# Patient Record
Sex: Male | Born: 1975 | Race: Black or African American | Hispanic: No | Smoking: Former smoker
Health system: Southern US, Community
[De-identification: ages and names within clinical notes are randomized; demographics above are authoritative.]

## PROBLEM LIST (undated history)

## (undated) DIAGNOSIS — I10 Essential (primary) hypertension: Secondary | ICD-10-CM

## (undated) DIAGNOSIS — G4730 Sleep apnea, unspecified: Secondary | ICD-10-CM

## (undated) DIAGNOSIS — E559 Vitamin D deficiency, unspecified: Secondary | ICD-10-CM

## (undated) DIAGNOSIS — F419 Anxiety disorder, unspecified: Secondary | ICD-10-CM

## (undated) DIAGNOSIS — F17200 Nicotine dependence, unspecified, uncomplicated: Secondary | ICD-10-CM

## (undated) DIAGNOSIS — E119 Type 2 diabetes mellitus without complications: Secondary | ICD-10-CM

---

## 2005-09-10 ENCOUNTER — Emergency Department: Payer: Self-pay | Admitting: General Practice

## 2005-12-01 ENCOUNTER — Emergency Department: Payer: Self-pay | Admitting: Emergency Medicine

## 2008-07-16 ENCOUNTER — Emergency Department: Payer: Self-pay | Admitting: Unknown Physician Specialty

## 2008-12-02 ENCOUNTER — Emergency Department: Payer: Self-pay | Admitting: Emergency Medicine

## 2009-05-13 ENCOUNTER — Ambulatory Visit: Payer: Self-pay | Admitting: Family Medicine

## 2010-04-27 ENCOUNTER — Encounter: Payer: Self-pay | Admitting: Internal Medicine

## 2012-05-03 ENCOUNTER — Emergency Department: Payer: Self-pay | Admitting: Emergency Medicine

## 2012-05-03 LAB — URINALYSIS, COMPLETE
Bacteria: NONE SEEN
Bilirubin,UR: NEGATIVE
Ketone: NEGATIVE
Leukocyte Esterase: NEGATIVE
Protein: NEGATIVE
Specific Gravity: 1.015 (ref 1.003–1.030)
Squamous Epithelial: <1
WBC UR: 1 /HPF (ref 0–5)

## 2012-05-03 LAB — TROPONIN I: Troponin-I: <0.02 ng/mL

## 2012-05-03 LAB — CBC
HCT: 43 % (ref 40.0–52.0)
HGB: 14.7 g/dL (ref 13.0–18.0)
MCH: 29.7 pg (ref 26.0–34.0)
MCHC: 34.1 g/dL (ref 32.0–36.0)
MCV: 87 fL (ref 80–100)
RBC: 4.93 10*6/uL (ref 4.40–5.90)
RDW: 14.5 % (ref 11.5–14.5)

## 2012-05-03 LAB — COMPREHENSIVE METABOLIC PANEL
Alkaline Phosphatase: 78 U/L (ref 50–136)
Anion Gap: 7 (ref 7–16)
BUN: 13 mg/dL (ref 7–18)
Calcium, Total: 8.3 mg/dL — ABNORMAL LOW (ref 8.5–10.1)
Co2: 25 mmol/L (ref 21–32)
Creatinine: 1.07 mg/dL (ref 0.60–1.30)
EGFR (African American): >60
SGOT(AST): 30 U/L (ref 15–37)

## 2012-05-03 LAB — LIPASE, BLOOD: Lipase: 116 U/L (ref 73–393)

## 2012-12-10 ENCOUNTER — Emergency Department: Payer: Self-pay | Admitting: Emergency Medicine

## 2014-06-25 ENCOUNTER — Emergency Department
Admit: 2014-06-25 | Disposition: A | Discharge status: Home / Self Care | Payer: Self-pay | Admitting: Emergency Medicine

## 2014-06-28 LAB — BETA STREP CULTURE(ARMC)

## 2015-01-09 ENCOUNTER — Emergency Department
Admission: EM | Admit: 2015-01-09 | Discharge: 2015-01-09 | Disposition: A | Discharge status: Home / Self Care | Payer: Self-pay | Attending: Emergency Medicine | Admitting: Emergency Medicine

## 2015-01-09 DIAGNOSIS — I10 Essential (primary) hypertension: Secondary | ICD-10-CM | POA: Insufficient documentation

## 2015-01-09 DIAGNOSIS — Z720 Tobacco use: Secondary | ICD-10-CM | POA: Insufficient documentation

## 2015-01-09 DIAGNOSIS — R079 Chest pain, unspecified: Secondary | ICD-10-CM | POA: Insufficient documentation

## 2015-01-09 HISTORY — DX: Essential (primary) hypertension: I10

## 2015-01-09 NOTE — Discharge Instructions (Signed)
Please seek medical attention for any high fevers, chest pain, shortness of breath, change in behavior, persistent vomiting, bloody stool or any other new or concerning symptoms. ° ° °Nonspecific Chest Pain  °Chest pain can be caused by many different conditions. There is always a chance that your pain could be related to something serious, such as a heart attack or a blood clot in your lungs. Chest pain can also be caused by conditions that are not life-threatening. If you have chest pain, it is very important to follow up with your health care provider. °CAUSES  °Chest pain can be caused by: °· Heartburn. °· Pneumonia or bronchitis. °· Anxiety or stress. °· Inflammation around your heart (pericarditis) or lung (pleuritis or pleurisy). °· A blood clot in your lung. °· A collapsed lung (pneumothorax). It can develop suddenly on its own (spontaneous pneumothorax) or from trauma to the chest. °· Shingles infection (varicella-zoster virus). °· Heart attack. °· Damage to the bones, muscles, and cartilage that make up your chest wall. This can include: °¨ Bruised bones due to injury. °¨ Strained muscles or cartilage due to frequent or repeated coughing or overwork. °¨ Fracture to one or more ribs. °¨ Sore cartilage due to inflammation (costochondritis). °RISK FACTORS  °Risk factors for chest pain may include: °· Activities that increase your risk for trauma or injury to your chest. °· Respiratory infections or conditions that cause frequent coughing. °· Medical conditions or overeating that can cause heartburn. °· Heart disease or family history of heart disease. °· Conditions or health behaviors that increase your risk of developing a blood clot. °· Having had chicken pox (varicella zoster). °SIGNS AND SYMPTOMS °Chest pain can feel like: °· Burning or tingling on the surface of your chest or deep in your chest. °· Crushing, pressure, aching, or squeezing pain. °· Dull or sharp pain that is worse when you move, cough, or  take a deep breath. °· Pain that is also felt in your back, neck, shoulder, or arm, or pain that spreads to any of these areas. °Your chest pain may come and go, or it may stay constant. °DIAGNOSIS °Lab tests or other studies may be needed to find the cause of your pain. Your health care provider may have you take a test called an ambulatory ECG (electrocardiogram). An ECG records your heartbeat patterns at the time the test is performed. You may also have other tests, such as: °· Transthoracic echocardiogram (TTE). During echocardiography, sound waves are used to create a picture of all of the heart structures and to look at how blood flows through your heart. °· Transesophageal echocardiogram (TEE). This is a more advanced imaging test that obtains images from inside your body. It allows your health care provider to see your heart in finer detail. °· Cardiac monitoring. This allows your health care provider to monitor your heart rate and rhythm in real time. °· Holter monitor. This is a portable device that records your heartbeat and can help to diagnose abnormal heartbeats. It allows your health care provider to track your heart activity for several days, if needed. °· Stress tests. These can be done through exercise or by taking medicine that makes your heart beat more quickly. °· Blood tests. °· Imaging tests. °TREATMENT  °Your treatment depends on what is causing your chest pain. Treatment may include: °· Medicines. These may include: °¨ Acid blockers for heartburn. °¨ Anti-inflammatory medicine. °¨ Pain medicine for inflammatory conditions. °¨ Antibiotic medicine, if an infection is present. °¨ Medicines   to dissolve blood clots. °¨ Medicines to treat coronary artery disease. °· Supportive care for conditions that do not require medicines. This may include: °¨ Resting. °¨ Applying heat or cold packs to injured areas. °¨ Limiting activities until pain decreases. °HOME CARE INSTRUCTIONS °· If you were prescribed  an antibiotic medicine, finish it all even if you start to feel better. °· Avoid any activities that bring on chest pain. °· Do not use any tobacco products, including cigarettes, chewing tobacco, or electronic cigarettes. If you need help quitting, ask your health care provider. °· Do not drink alcohol. °· Take medicines only as directed by your health care provider. °· Keep all follow-up visits as directed by your health care provider. This is important. This includes any further testing if your chest pain does not go away. °· If heartburn is the cause for your chest pain, you may be told to keep your head raised (elevated) while sleeping. This reduces the chance that acid will go from your stomach into your esophagus. °· Make lifestyle changes as directed by your health care provider. These may include: °¨ Getting regular exercise. Ask your health care provider to suggest some activities that are safe for you. °¨ Eating a heart-healthy diet. A registered dietitian can help you to learn healthy eating options. °¨ Maintaining a healthy weight. °¨ Managing diabetes, if necessary. °¨ Reducing stress. °SEEK MEDICAL CARE IF: °· Your chest pain does not go away after treatment. °· You have a rash with blisters on your chest. °· You have a fever. °SEEK IMMEDIATE MEDICAL CARE IF:  °· Your chest pain is worse. °· You have an increasing cough, or you cough up blood. °· You have severe abdominal pain. °· You have severe weakness. °· You faint. °· You have chills. °· You have sudden, unexplained chest discomfort. °· You have sudden, unexplained discomfort in your arms, back, neck, or jaw. °· You have shortness of breath at any time. °· You suddenly start to sweat, or your skin gets clammy. °· You feel nauseous or you vomit. °· You suddenly feel light-headed or dizzy. °· Your heart begins to beat quickly, or it feels like it is skipping beats. °These symptoms may represent a serious problem that is an emergency. Do not wait to  see if the symptoms will go away. Get medical help right away. Call your local emergency services (911 in the [place] Do not drive yourself to the hospital. °  °This information is not intended to replace advice given to you by your health care provider. Make sure you discuss any questions you have with your health care provider. °  °Document Released: 12/07/2004 Document Revised: 03/20/2014 Document Reviewed: 10/03/2013 °Elsevier Interactive Patient Education ©2016 [employer] ° °

## 2015-01-09 NOTE — ED Provider Notes (Signed)
[facility] Emergency Department Provider Note  ____________________________________________  Time seen: 1110  I have reviewed the triage vital signs and the nursing notes.   HISTORY  Chief Complaint Chest Pain   History limited by: Not Limited   HPI Kevin Garrett is a 39 y.o. male who was registered to be seen after a brief episode of sharp chest pain. The patient was in the emergency department having brought his daughter in to be evaluated for abdominal pain. Whilst waiting he had a 3-4 second episode of sharp left lower chest pain. He denies any associated diaphoresis or shortness of breath. He states he is getting these pains occasionally. He denies any recent fevers. He states he had a sausage biscuit for breakfast this morning. He has never been placed on antacids.  Past Medical History  Diagnosis Date  . Hypertension     There are no active problems to display for this patient.   No past surgical history on file.  No current outpatient prescriptions on file.  Allergies Review of patient's allergies indicates no known allergies.  History reviewed. No pertinent family history.  Social History Social History  Substance Use Topics  . Smoking status: Current Some Day Smoker -- 0.50 packs/day    Types: Cigarettes  . Smokeless tobacco: Never Used  . Alcohol Use: No    Review of Systems  Constitutional: Negative for fever. Cardiovascular: Positive for sharp chest pain Respiratory: Negative for shortness of breath. Gastrointestinal: Negative for abdominal pain, vomiting and diarrhea. Genitourinary: Negative for dysuria. Musculoskeletal: Negative for back pain. Skin: Negative for rash. Neurological: Negative for headaches, focal weakness or numbness.   10-point ROS otherwise negative.  ____________________________________________   PHYSICAL EXAM:  VITAL SIGNS: ED Triage Vitals  Enc Vitals Group     BP --      Pulse Rate  01/09/15 1039 67     Resp --      Temp 01/09/15 1039 98.2 F (36.8 C)     Temp Source 01/09/15 1039 Oral     SpO2 01/09/15 1039 96 %     Weight 01/09/15 1039 265 lb (120.203 kg)     Height 01/09/15 1039 5\' 9"  (1.753 m)     Head Cir --      Peak Flow --      Pain Score 01/09/15 1039 4   Constitutional: Alert and oriented. Well appearing and in no distress. Eyes: Conjunctivae are normal. PERRL. Normal extraocular movements. ENT   Head: Normocephalic and atraumatic.   Nose: No congestion/rhinnorhea.   Mouth/Throat: Mucous membranes are moist.   Neck: No stridor. Hematological/Lymphatic/Immunilogical: No cervical lymphadenopathy. Cardiovascular: Normal rate, regular rhythm.  No murmurs, rubs, or gallops. Respiratory: Normal respiratory effort without tachypnea nor retractions. Breath sounds are clear and equal bilaterally. No wheezes/rales/rhonchi. Gastrointestinal: Soft and nontender. No distention. There is no CVA tenderness. Genitourinary: Deferred Musculoskeletal: Normal range of motion in all extremities. No joint effusions.  No lower extremity tenderness nor edema. Neurologic:  Normal speech and language. No gross focal neurologic deficits are appreciated. Speech is normal.  Skin:  Skin is warm, dry and intact. No rash noted. Psychiatric: Mood and affect are normal. Speech and behavior are normal. Patient exhibits appropriate insight and judgment.  ____________________________________________    LABS (pertinent positives/negatives)  None  ____________________________________________   EKG  I, Nance Pear, attending physician, personally viewed and interpreted this EKG  EKG Time: 1037 Rate: 64 Rhythm: NSR Axis: normal Intervals: qtc 441 QRS: narrow ST changes:  no st elevation, t wave inversion V6 Impression: T wave inversion V6, otherwise normal ekg  ____________________________________________     RADIOLOGY  None   ____________________________________________   PROCEDURES  Procedure(s) performed: None  Critical Care performed: No  ____________________________________________   INITIAL IMPRESSION / ASSESSMENT AND PLAN / ED COURSE  Pertinent labs & imaging results that were available during my care of the patient were reviewed by me and considered in my medical decision making (see chart for details).  Patient checked in because of brief episode of sharp chest pain. No diaphoresis or shortness of breath. No radiation. Given clinical history I doubt ACS. EKG without any ST elevation. I think more likely patient having episodes of GERD given that this has been recurrent. Discussed with patient he can try an antacid.  ____________________________________________   FINAL CLINICAL IMPRESSION(S) / ED DIAGNOSES  Final diagnoses:  Chest pain, unspecified chest pain type     Nance Pear, MD 01/09/15 1135

## 2015-01-09 NOTE — ED Notes (Signed)
Pt in ED with family and developed CP on the lobby. Pt denies and SOB or dyspnea.

## 2015-01-28 ENCOUNTER — Encounter: Payer: Self-pay | Admitting: Emergency Medicine

## 2015-01-28 ENCOUNTER — Emergency Department
Admission: EM | Admit: 2015-01-28 | Discharge: 2015-01-28 | Disposition: A | Discharge status: Home / Self Care | Payer: Self-pay | Attending: Emergency Medicine | Admitting: Emergency Medicine

## 2015-01-28 ENCOUNTER — Emergency Department: Payer: Self-pay

## 2015-01-28 DIAGNOSIS — J029 Acute pharyngitis, unspecified: Secondary | ICD-10-CM | POA: Insufficient documentation

## 2015-01-28 DIAGNOSIS — F17210 Nicotine dependence, cigarettes, uncomplicated: Secondary | ICD-10-CM | POA: Insufficient documentation

## 2015-01-28 DIAGNOSIS — I10 Essential (primary) hypertension: Secondary | ICD-10-CM | POA: Insufficient documentation

## 2015-01-28 LAB — COMPREHENSIVE METABOLIC PANEL
ALBUMIN: 4 g/dL (ref 3.5–5.0)
ALK PHOS: 67 U/L (ref 38–126)
ALT: 14 U/L — AB (ref 17–63)
ANION GAP: 6 (ref 5–15)
AST: 20 U/L (ref 15–41)
BILIRUBIN TOTAL: 1 mg/dL (ref 0.3–1.2)
BUN: 16 mg/dL (ref 6–20)
CALCIUM: 9 mg/dL (ref 8.9–10.3)
CO2: 28 mmol/L (ref 22–32)
CREATININE: 1.16 mg/dL (ref 0.61–1.24)
Chloride: 101 mmol/L (ref 101–111)
GFR calc Af Amer: >60 mL/min (ref >60)
GFR calc non Af Amer: >60 mL/min (ref >60)
GLUCOSE: 107 mg/dL — AB (ref 65–99)
Potassium: 3.9 mmol/L (ref 3.5–5.1)
SODIUM: 135 mmol/L (ref 135–145)
TOTAL PROTEIN: 7.8 g/dL (ref 6.5–8.1)

## 2015-01-28 LAB — CBC WITH DIFFERENTIAL/PLATELET
BASOS PCT: 1 %
Basophils Absolute: 0 10*3/uL (ref 0–0.1)
EOS ABS: 0.1 10*3/uL (ref 0–0.7)
Eosinophils Relative: 1 %
HEMATOCRIT: 43.9 % (ref 40.0–52.0)
HEMOGLOBIN: 14.5 g/dL (ref 13.0–18.0)
Lymphocytes Relative: 21 %
Lymphs Abs: 1.6 10*3/uL (ref 1.0–3.6)
MCH: 28.8 pg (ref 26.0–34.0)
MCHC: 33 g/dL (ref 32.0–36.0)
MCV: 87.2 fL (ref 80.0–100.0)
Monocytes Absolute: 0.8 10*3/uL (ref 0.2–1.0)
Monocytes Relative: 11 %
NEUTROS ABS: 5.1 10*3/uL (ref 1.4–6.5)
NEUTROS PCT: 66 %
Platelets: 173 10*3/uL (ref 150–440)
RBC: 5.03 MIL/uL (ref 4.40–5.90)
RDW: 14.3 % (ref 11.5–14.5)
WBC: 7.6 10*3/uL (ref 3.8–10.6)

## 2015-01-28 MED ORDER — SODIUM CHLORIDE 0.9 % IV BOLUS (SEPSIS)
1000.0000 mL | Freq: Once | INTRAVENOUS | Status: AC
  Administered 2015-01-28: 1000 mL via INTRAVENOUS

## 2015-01-28 MED ORDER — AMOXICILLIN-POT CLAVULANATE 875-125 MG PO TABS: 1.0000 | ORAL_TABLET | Freq: Two times a day (BID) | ORAL | Status: DC

## 2015-01-28 MED ORDER — DEXAMETHASONE SODIUM PHOSPHATE 10 MG/ML IJ SOLN
10.0000 mg | Freq: Once | INTRAMUSCULAR | Status: AC
  Administered 2015-01-28: 10 mg via INTRAVENOUS
  Filled 2015-01-28: qty 1

## 2015-01-28 MED ORDER — ACETAMINOPHEN-CODEINE #3 300-30 MG PO TABS: 1.0000 | ORAL_TABLET | ORAL | Status: DC | PRN

## 2015-01-28 MED ORDER — PREDNISONE 10 MG (21) PO TBPK: ORAL_TABLET | ORAL | Status: DC

## 2015-01-28 MED ORDER — KETOROLAC TROMETHAMINE 30 MG/ML IJ SOLN
30.0000 mg | Freq: Once | INTRAMUSCULAR | Status: AC
  Administered 2015-01-28: 30 mg via INTRAVENOUS
  Filled 2015-01-28: qty 1

## 2015-01-28 MED ORDER — IOHEXOL 300 MG/ML  SOLN
75.0000 mL | Freq: Once | INTRAMUSCULAR | Status: AC | PRN
  Administered 2015-01-28: 75 mL via INTRAVENOUS
  Filled 2015-01-28: qty 75

## 2015-01-28 NOTE — ED Provider Notes (Signed)
[facility] Emergency Department Provider Note  ____________________________________________  Time seen: Approximately 10:55 AM  I have reviewed the triage vital signs and the nursing notes.   HISTORY  Chief Complaint Sore Throat   HPI Kevin Garrett is a 39 y.o. male who presents to the emergency department for evaluation of sore throat. He states that the pain started on Sunday and has progressively worsened. He states that he has had a fever off-and-on. He states that it is very painful to swallow.   Past Medical History  Diagnosis Date  . Hypertension     There are no active problems to display for this patient.   History reviewed. No pertinent past surgical history.  Current Outpatient Rx  Name  Route  Sig  Dispense  Refill  . acetaminophen-codeine (TYLENOL #3) 300-30 MG tablet   Oral   Take 1-2 tablets by mouth every 4 (four) hours as needed for moderate pain.   12 tablet   0   . amoxicillin-clavulanate (AUGMENTIN) 875-125 MG tablet   Oral   Take 1 tablet by mouth 2 (two) times daily.   20 tablet   0   . predniSONE (STERAPRED UNI-PAK 21 TAB) 10 MG (21) TBPK tablet      Take 6 tablets on day 1 Take 5 tablets on day 2 Take 4 tablets on day 3 Take 3 tablets on day 4 Take 2 tablets on day 5 Take 1 tablet on day 6   21 tablet   0     Allergies Review of patient's allergies indicates no known allergies.  History reviewed. No pertinent family history.  Social History Social History  Substance Use Topics  . Smoking status: Current Some Day Smoker -- 0.50 packs/day    Types: Cigarettes  . Smokeless tobacco: Never Used  . Alcohol Use: No    Review of Systems Constitutional: Positive for fever. Eyes: No visual changes. ENT: Positive for sore throat; positive for difficulty swallowing. Respiratory: Denies shortness of breath. Gastrointestinal: No abdominal pain.  No nausea, no vomiting.  No diarrhea.  Genitourinary: Negative  for dysuria. Musculoskeletal: Positive for generalized body aches. Skin: Negative for rash. Neurological: Negative for headaches, focal weakness or numbness.  10-point ROS otherwise negative.  ____________________________________________   PHYSICAL EXAM:  VITAL SIGNS: ED Triage Vitals  Enc Vitals Group     BP 01/28/15 1035 158/98 mmHg     Pulse Rate 01/28/15 1035 78     Resp 01/28/15 1035 20     Temp 01/28/15 1035 98.7 F (37.1 C)     Temp Source 01/28/15 1035 Oral     SpO2 01/28/15 1035 96 %     Weight 01/28/15 1035 265 lb (120.203 kg)     Height --      Head Cir --      Peak Flow --      Pain Score 01/28/15 1035 10     Pain Loc --      Pain Edu? --      Excl. in [place]? --     Constitutional: Alert and oriented. Well appearing and in no acute distress. Eyes: Conjunctivae are normal. PERRL. EOMI. Head: Atraumatic. Nose: No congestion/rhinnorhea. Mouth/Throat: Mucous membranes are moist.  Oropharynx erythematous and mildly edematous, positive for exudate. Right tonsil appears enlarged in comparison with left. Neck: No stridor.  Lymphatic: Cervical adenopathy noted laterally Cardiovascular: Normal rate, regular rhythm. Good peripheral circulation. Respiratory: Normal respiratory effort. Lungs CTAB. Gastrointestinal: Soft and nontender. Musculoskeletal: No  lower extremity tenderness nor edema.   Neurologic:  Normal speech and language. No gross focal neurologic deficits are appreciated. Speech is normal. No gait instability. Skin:  Skin is warm, dry and intact. No rash noted Psychiatric: Mood and affect are normal. Speech and behavior are normal.  ____________________________________________   LABS (all labs ordered are listed, but only abnormal results are displayed)  Labs Reviewed  COMPREHENSIVE METABOLIC PANEL - Abnormal; Notable for the following:    Glucose, Bld 107 (*)    ALT 14 (*)    All other components within normal limits  CBC WITH DIFFERENTIAL/PLATELET    ____________________________________________  EKG   ____________________________________________  RADIOLOGY  Mild bilateral tonsillar enlargement/edema. No evidence of tonsillar/peritonsillar abscess. ____________________________________________   PROCEDURES  Procedure(s) performed: None  Critical Care performed: No  ____________________________________________   INITIAL IMPRESSION / ASSESSMENT AND PLAN / ED COURSE  Pertinent labs & imaging results that were available during my care of the patient were reviewed by me and considered in my medical decision making (see chart for details).  IV Unasyn given in the emergency department along with decadron and toradol with some relief. He is to follow up with the ENT specialist if not improving over the next 2-3 days. Return precautions given. ____________________________________________   FINAL CLINICAL IMPRESSION(S) / ED DIAGNOSES  Final diagnoses:  Acute pharyngitis, unspecified etiology      Victorino Dike, FNP 01/28/15 1335  Ahmed Prima, MD 01/28/15 [phone]

## 2015-01-28 NOTE — Discharge Instructions (Signed)

## 2015-01-28 NOTE — ED Notes (Signed)
Pt to ed with c/o sore throat x 4 days.

## 2015-09-30 ENCOUNTER — Emergency Department
Admission: EM | Admit: 2015-09-30 | Discharge: 2015-09-30 | Disposition: A | Discharge status: Home / Self Care | Payer: Self-pay | Attending: Emergency Medicine | Admitting: Emergency Medicine

## 2015-09-30 ENCOUNTER — Encounter: Payer: Self-pay | Admitting: Emergency Medicine

## 2015-09-30 ENCOUNTER — Emergency Department: Payer: Self-pay

## 2015-09-30 DIAGNOSIS — I10 Essential (primary) hypertension: Secondary | ICD-10-CM | POA: Insufficient documentation

## 2015-09-30 DIAGNOSIS — F17210 Nicotine dependence, cigarettes, uncomplicated: Secondary | ICD-10-CM | POA: Insufficient documentation

## 2015-09-30 DIAGNOSIS — Z79899 Other long term (current) drug therapy: Secondary | ICD-10-CM | POA: Insufficient documentation

## 2015-09-30 DIAGNOSIS — R42 Dizziness and giddiness: Secondary | ICD-10-CM | POA: Insufficient documentation

## 2015-09-30 HISTORY — DX: Anxiety disorder, unspecified: F41.9

## 2015-09-30 LAB — BASIC METABOLIC PANEL
ANION GAP: 7 (ref 5–15)
BUN: 10 mg/dL (ref 6–20)
CALCIUM: 8.6 mg/dL — AB (ref 8.9–10.3)
CO2: 28 mmol/L (ref 22–32)
Chloride: 102 mmol/L (ref 101–111)
Creatinine, Ser: 0.98 mg/dL (ref 0.61–1.24)
Glucose, Bld: 134 mg/dL — ABNORMAL HIGH (ref 65–99)
Potassium: 3.3 mmol/L — ABNORMAL LOW (ref 3.5–5.1)
SODIUM: 137 mmol/L (ref 135–145)

## 2015-09-30 LAB — CBC
HCT: 43.9 % (ref 40.0–52.0)
HEMOGLOBIN: 15.2 g/dL (ref 13.0–18.0)
MCH: 29.8 pg (ref 26.0–34.0)
MCHC: 34.6 g/dL (ref 32.0–36.0)
MCV: 86.2 fL (ref 80.0–100.0)
Platelets: 201 10*3/uL (ref 150–440)
RBC: 5.1 MIL/uL (ref 4.40–5.90)
RDW: 14.5 % (ref 11.5–14.5)
WBC: 4.3 10*3/uL (ref 3.8–10.6)

## 2015-09-30 MED ORDER — MECLIZINE HCL 12.5 MG PO TABS: 12.5000 mg | ORAL_TABLET | Freq: Three times a day (TID) | ORAL | Status: DC | PRN

## 2015-09-30 MED ORDER — MECLIZINE HCL 25 MG PO TABS
25.0000 mg | ORAL_TABLET | Freq: Once | ORAL | Status: AC
  Administered 2015-09-30: 25 mg via ORAL
  Filled 2015-09-30: qty 1

## 2015-09-30 NOTE — ED Notes (Addendum)
C/O dizziness and blurred vision onset of symptoms yesterday.  States symptoms worsening.  BP initialy 153/100 per EMS

## 2015-09-30 NOTE — ED Provider Notes (Signed)
Time Seen: Approximately *0754 I have reviewed the triage notes  Chief Complaint: Dizziness   History of Present Illness: Kevin Garrett is a 40 y.o. male who presents with feeling of dizziness that occurred over the last 48 hours. He states it started yesterday morning and during work. He states he's been very sleepy. He denies any headaches but has had some ringing and his right ear. He denies any visual field deficits. He states he's had blurred vision that usually corrects after he lies still for a period of time. He describes his dizziness as being vertiginous in nature where he states he feels like he is spinning. He states his symptoms still exhaust when he lies still. He denies any neck pain or fever. Patient has a known history of hypertension states he has been taking his medications. No nausea or vomiting  Past Medical History  Diagnosis Date  . Hypertension   . Anxiety     There are no active problems to display for this patient.   History reviewed. No pertinent past surgical history.  History reviewed. No pertinent past surgical history.  No current outpatient prescriptions on file.  Allergies:  Review of patient's allergies indicates no known allergies.  Family History: No family history on file.  Social History: Social History  Substance Use Topics  . Smoking status: Current Some Day Smoker -- 0.50 packs/day    Types: Cigarettes  . Smokeless tobacco: Never Used  . Alcohol Use: No     Review of Systems:   10 point review of systems was performed and was otherwise negative:  Constitutional: No fever Eyes: NoCurrent visual disturbances ENT: No sore throat, ear pain Cardiac: No chest pain Respiratory: No shortness of breath, wheezing, or stridor Abdomen: No abdominal pain, no vomiting, No diarrhea Endocrine: No weight loss, No night sweats Extremities: No peripheral edema, cyanosis Skin: No rashes, easy bruising Neurologic: No focal weakness, trouble  with speech or swollowing Urologic: No dysuria, Hematuria, or urinary frequency   Physical Exam:  ED Triage Vitals  Enc Vitals Group     BP 09/30/15 0716 132/92 mmHg     Pulse --      Resp 09/30/15 0716 15     Temp 09/30/15 0716 97.9 F (36.6 C)     Temp Source 09/30/15 0716 Oral     SpO2 09/30/15 0716 95 %     Weight 09/30/15 0716 283 lb (128.368 kg)     Height 09/30/15 0716 5\' 9"  (1.753 m)     Head Cir --      Peak Flow --      Pain Score 09/30/15 0715 0     Pain Loc --      Pain Edu? --      Excl. in [place]? --     General: Awake , Alert , and Oriented times 3; GCS 15 Head: Normal cephalic , atraumatic Eyes: Pupils equal , round, reactive to light TMs are negative bilaterally for erythema or exudate Nose/Throat: No nasal drainage, patent upper airway without erythema or exudate.  Neck: Supple, Full range of motion, No anterior adenopathy or palpable thyroid masses Lungs: Clear to ascultation without wheezes , rhonchi, or rales Heart: Regular rate, regular rhythm without murmurs , gallops , or rubs Abdomen: Soft, non tender without rebound, guarding , or rigidity; bowel sounds positive and symmetric in all 4 quadrants. No organomegaly .        Extremities: 2 plus symmetric pulses. No edema, clubbing or cyanosis  Neurologic: normal ambulation, Motor symmetric without deficits, sensory intact. Patient had an positive symptoms with Epley maneuver Skin: warm, dry, no rashes   Labs:   All laboratory work was reviewed including any pertinent negatives or positives listed below:  Labs Reviewed  BASIC METABOLIC PANEL - Abnormal; Notable for the following:    Potassium 3.3 (*)    Glucose, Bld 134 (*)    Calcium 8.6 (*)    All other components within normal limits  CBC  URINALYSIS COMPLETEWITH MICROSCOPIC ([facility] ONLY)  CBG MONITORING, ED    EKG: * ED ECG REPORT I, Daymon Larsen, the attending physician, personally viewed and interpreted this ECG.  Date: 09/30/2015 EKG  Time: 0715 Rate: 81 Rhythm: normal sinus rhythm QRS Axis: normal Intervals: normal ST/T Wave abnormalities: Nonspecific T wave abnormality  Conduction Disturbances: none Narrative Interpretation: unremarkable No significant change in comparison to 01/09/2015   Radiology: *       CT HEAD WO CONTRAST (Final result) Result time: 09/30/15 08:13:27   Final result by Rad Results In Interface (09/30/15 08:13:27)   Narrative:   CLINICAL DATA: Dizziness and weakness since yesterday.  EXAM: CT HEAD WITHOUT CONTRAST  TECHNIQUE: Contiguous axial images were obtained from the base of the skull through the vertex without intravenous contrast.  COMPARISON: None.  FINDINGS: Brain: Gray-white differentiation is maintained. No CT evidence of acute large territory infarct. No intraparenchymal or extra-axial mass or hemorrhage. Normal size a configuration of the ventricles and basilar cisterns. No midline shift.  Vascular: No hyperdense vessel or unexpected calcification.  Skull: No displaced calvarial fracture.  Sinuses/Orbits: Limited visualization the paranasal sinuses and mastoid air cells is normal. No air-fluid levels.  Other: Regional soft tissues appear normal.  IMPRESSION: Negative noncontrast head CT.   Electronically Signed By: Sandi Mariscal M.D. On: 09/30/2015 08:13        I personally reviewed the radiologic studies   ED Course:  Patient felt improved after Epley maneuvers and some oral Antivert. Patient had a head CT due to his first episode of vertigo. Patient does not appear to have any signs of central vertigo at this time. Patient's otherwise hemodynamically stable.   Assessment:  Peripheral vertigo*      Plan:  Outpatient management Patient was advised to return immediately if condition worsens. Patient was advised to follow up with their primary care physician or other specialized physicians involved in their outpatient care. The  patient and/or family member/power of attorney had laboratory results reviewed at the bedside. All questions and concerns were addressed and appropriate discharge instructions were distributed by the nursing staff.             Daymon Larsen, MD 09/30/15 1007

## 2015-09-30 NOTE — ED Notes (Signed)
AAOx3.  Skin warm and dry.  Ambulates with easy and steady gait. NAD 

## 2015-09-30 NOTE — Discharge Instructions (Signed)
Vertigo Vertigo means you feel like you or your surroundings are moving when they are not. Vertigo can be dangerous if it occurs when you are at work, driving, or performing difficult activities.  CAUSES  Vertigo occurs when there is a conflict of signals sent to your brain from the visual and sensory systems in your body. There are many different causes of vertigo, including:  Infections, especially in the inner ear.  A bad reaction to a drug or misuse of alcohol and medicines.  Withdrawal from drugs or alcohol.  Rapidly changing positions, such as lying down or rolling over in bed.  A migraine headache.  Decreased blood flow to the brain.  Increased pressure in the brain from a head injury, infection, tumor, or bleeding. SYMPTOMS  You may feel as though the world is spinning around or you are falling to the ground. Because your balance is upset, vertigo can cause nausea and vomiting. You may have involuntary eye movements (nystagmus). DIAGNOSIS  Vertigo is usually diagnosed by physical exam. If the cause of your vertigo is unknown, your caregiver may perform imaging tests, such as an MRI scan (magnetic resonance imaging). TREATMENT  Most cases of vertigo resolve on their own, without treatment. Depending on the cause, your caregiver may prescribe certain medicines. If your vertigo is related to body position issues, your caregiver may recommend movements or procedures to correct the problem. In rare cases, if your vertigo is caused by certain inner ear problems, you may need surgery. HOME CARE INSTRUCTIONS   Follow your caregiver's instructions.  Avoid driving.  Avoid operating heavy machinery.  Avoid performing any tasks that would be dangerous to you or others during a vertigo episode.  Tell your caregiver if you notice that certain medicines seem to be causing your vertigo. Some of the medicines used to treat vertigo episodes can actually make them worse in some people. SEEK  IMMEDIATE MEDICAL CARE IF:   Your medicines do not relieve your vertigo or are making it worse.  You develop problems with talking, walking, weakness, or using your arms, hands, or legs.  You develop severe headaches.  Your nausea or vomiting continues or gets worse.  You develop visual changes.  A family member notices behavioral changes.  Your condition gets worse. MAKE SURE YOU:  Understand these instructions.  Will watch your condition.  Will get help right away if you are not doing well or get worse.   This information is not intended to replace advice given to you by your health care provider. Make sure you discuss any questions you have with your health care provider.   Document Released: 12/07/2004 Document Revised: 05/22/2011 Document Reviewed: 06/22/2014 Elsevier Interactive Patient Education [employer].   Please return immediately if condition worsens. Please contact her primary physician or the physician you were given for referral. If you have any specialist physicians involved in her treatment and plan please also contact them. Thank you for using [facility].

## 2015-12-21 ENCOUNTER — Encounter: Payer: Self-pay | Admitting: Emergency Medicine

## 2015-12-21 ENCOUNTER — Emergency Department
Admission: EM | Admit: 2015-12-21 | Discharge: 2015-12-21 | Disposition: A | Discharge status: Home / Self Care | Payer: Self-pay | Attending: Emergency Medicine | Admitting: Emergency Medicine

## 2015-12-21 DIAGNOSIS — Z79899 Other long term (current) drug therapy: Secondary | ICD-10-CM | POA: Insufficient documentation

## 2015-12-21 DIAGNOSIS — J069 Acute upper respiratory infection, unspecified: Secondary | ICD-10-CM | POA: Insufficient documentation

## 2015-12-21 DIAGNOSIS — B9789 Other viral agents as the cause of diseases classified elsewhere: Secondary | ICD-10-CM

## 2015-12-21 DIAGNOSIS — F17210 Nicotine dependence, cigarettes, uncomplicated: Secondary | ICD-10-CM | POA: Insufficient documentation

## 2015-12-21 DIAGNOSIS — I10 Essential (primary) hypertension: Secondary | ICD-10-CM | POA: Insufficient documentation

## 2015-12-21 DIAGNOSIS — E118 Type 2 diabetes mellitus with unspecified complications: Secondary | ICD-10-CM

## 2015-12-21 DIAGNOSIS — E119 Type 2 diabetes mellitus without complications: Secondary | ICD-10-CM | POA: Insufficient documentation

## 2015-12-21 LAB — GLUCOSE, CAPILLARY: GLUCOSE-CAPILLARY: 137 mg/dL — AB (ref 65–99)

## 2015-12-21 MED ORDER — PROMETHAZINE-DM 6.25-15 MG/5ML PO SYRP: 5.0000 mL | ORAL_SOLUTION | Freq: Four times a day (QID) | ORAL | 0 refills | Status: DC | PRN

## 2015-12-21 MED ORDER — LORATADINE 10 MG PO TABS: 10.0000 mg | ORAL_TABLET | Freq: Every day | ORAL | 0 refills | Status: DC

## 2015-12-21 NOTE — ED Provider Notes (Signed)
[facility] Emergency Department Provider Note  ____________________________________________  Time seen: Approximately 7:31 AM  I have reviewed the triage vital signs and the nursing notes.   HISTORY  Chief Complaint Cough    HPI SAHITH Garrett is a 40 y.o. male , NAD, presents to the emergency room today history of cough. Patient has had a dry cough with mild headache over the last couple of days. Has noted some increased urination and blurred vision over the last 12 hours. Woke this morning had a coughing fit and felt short of breath therefore called 911. Patient was concerned that his blood pressure was elevated and he wanted his blood pressure checked by EMS. Has a history of type 2 diabetes but is not on any medications and states he is controlled by diet. Has not checked his blood sugars in some time. Has not had no further episodes of shortness of breath but has had cough since being transported by EMS. States he feels better since arriving to the hospital. Has had no chest pain, wheezing, chest congestion. Denies nasal congestion, runny nose, ear pain. Has had some sinus headaches but loss of vision. No fevers, chills or body aches. Has not taken anything over-the-counter for his symptoms. Denies dysuria, hematuria. No abdominal pain, nausea or vomiting.   Past Medical History:  Diagnosis Date  . Anxiety   . Hypertension     There are no active problems to display for this patient.   History reviewed. No pertinent surgical history.  Prior to Admission medications   Medication Sig Start Date End Date Taking? Authorizing Provider  amLODipine (NORVASC) 10 MG tablet Take 10 mg by mouth daily.    Historical Provider, MD  citalopram (CELEXA) 20 MG tablet Take 30 mg by mouth daily.    Historical Provider, MD  loratadine (CLARITIN) 10 MG tablet Take 1 tablet (10 mg total) by mouth daily. 12/21/15   Mahmoud Blazejewski L Sharla Tankard, PA-C  meclizine (ANTIVERT) 12.5 MG tablet  Take 1 tablet (12.5 mg total) by mouth 3 (three) times daily as needed for dizziness or nausea. 09/30/15   Daymon Larsen, MD  promethazine-dextromethorphan (PROMETHAZINE-DM) 6.25-15 MG/5ML syrup Take 5 mLs by mouth 4 (four) times daily as needed for cough. 12/21/15   Iram Lundberg L Nadelyn Enriques, PA-C  tamsulosin (FLOMAX) 0.4 MG CAPS capsule Take 0.4 mg by mouth daily.    Historical Provider, MD    Allergies Review of patient's allergies indicates no known allergies.  History reviewed. No pertinent family history.  Social History Social History  Substance Use Topics  . Smoking status: Current Some Day Smoker    Packs/day: 0.50    Types: Cigarettes  . Smokeless tobacco: Never Used  . Alcohol use No     Review of Systems  Constitutional: No fever/chills Eyes: No visual changes. No discharge ENT: No sore throatOr nasal congestion, runny nose, ear pain. Cardiovascular: No chest pain. Respiratory: Positive cough with associated shortness of breath with cough only. No wheezing.  Gastrointestinal: No abdominal pain.  No nausea, vomiting.   Genitourinary: Positive increased urinary frequency. Negative for dysuria, hematuria. No urinary hesitancy, urgency Musculoskeletal: Negative for general myalgias.  Skin: Negative for rash. Neurological: Positive for sinus headaches, but no focal weakness or numbness. 10-point ROS otherwise negative.  ____________________________________________   PHYSICAL EXAM:  VITAL SIGNS: ED Triage Vitals [12/21/15 0643]  Enc Vitals Group     BP (!) 152/99     Pulse Rate 82     Resp 14  Temp      Temp src      SpO2 99 %     Weight 260 lb (117.9 kg)     Height 5\' 9"  (1.753 m)     Head Circumference      Peak Flow      Pain Score      Pain Loc      Pain Edu?      Excl. in [place]?      Constitutional: Alert and oriented. Well appearing and in no acute distress. Eyes: Conjunctivae are normal Without icterus or injection Head: Atraumatic. ENT:      Ears: TMs  visualized bilaterally without erythema, bulging, effusion, perforation      Nose: No congestion/rhinnorhea.      Mouth/Throat: Mucous membranes are moist. Pharynx without erythema, swelling, exudate. Airway is patent but small due to large tongue and wide neck. Neck: Supple with full range of motion. Hematological/Lymphatic/Immunilogical: No cervical lymphadenopathy. Cardiovascular: Normal rate, regular rhythm. Normal S1 and S2.  No murmurs, rubs, gallops. Good peripheral circulation. Respiratory: Normal respiratory effort without tachypnea or retractions. Lungs CTAB with breath sounds noted in all lung fields. No wheeze, rhonchi, rales. Neurologic:  Normal speech and language. No gross focal neurologic deficits are appreciated.  Skin:  Skin is warm, dry and intact. No rash noted. Psychiatric: Mood and affect are normal. Speech and behavior are normal. Patient exhibits appropriate insight and judgement.   ____________________________________________   LABS (all labs ordered are listed, but only abnormal results are displayed)  Labs Reviewed  GLUCOSE, CAPILLARY - Abnormal; Notable for the following:       Result Value   Glucose-Capillary 137 (*)    All other components within normal limits  CBG MONITORING, ED   ____________________________________________  EKG  None ____________________________________________  RADIOLOGY  None ____________________________________________    PROCEDURES  Procedure(s) performed: None   Procedures   Medications - No data to display   ____________________________________________   INITIAL IMPRESSION / ASSESSMENT AND PLAN / ED COURSE  Pertinent labs & imaging results that were available during my care of the patient were reviewed by me and considered in my medical decision making (see chart for details).  Clinical Course  Comment By Time  Discussed lab results with the patient. He has not had anything to eat or drink other than  water since yesterday afternoon. Discussed that I anticipate his blood sugar would've been higher had heat ML later in the day. Recommended that he follow-up with his primary care provider at the [facility] to discuss medication management of his type 2 diabetes. Encouraged him to continue to follow her diabetic diet and keep himself hydrated with water as he has been doing. Information will be given to him in regards to diabetes and sick daily management as it can cause blood sugars to rise when he is ill. I anticipate the changes in vision and increased urinary frequency is related to uncontrolled diabetes. Calyx Hawker L Hawken Bielby, PA-C 10/10 0805    Patient's diagnosis is consistent with Viral URI with cough and type 2 diabetes with unspecified, patient's. Patient will be discharged home with prescriptions for loratadine and promethazine DM cough syrup to take as directed. Patient is advised to follow-up with his primary care provider at the [facility] to recheck sugars and discussed the possibility of medication management of diabetes. Patient is given ED precautions to return to the ED for any worsening or new symptoms.    ____________________________________________  FINAL CLINICAL  IMPRESSION(S) / ED DIAGNOSES  Final diagnoses:  Viral URI with cough  Type 2 diabetes mellitus with complication, without long-term current use of insulin (HCC)      NEW MEDICATIONS STARTED DURING THIS VISIT:  New Prescriptions   LORATADINE (CLARITIN) 10 MG TABLET    Take 1 tablet (10 mg total) by mouth daily.   PROMETHAZINE-DEXTROMETHORPHAN (PROMETHAZINE-DM) 6.25-15 MG/5ML SYRUP    Take 5 mLs by mouth 4 (four) times daily as needed for cough.         Braxton Feathers, PA-C 12/21/15 [id number]    Schuyler Amor, MD 12/21/15 [phone]

## 2015-12-21 NOTE — ED Triage Notes (Signed)
Pt arrived to triage by EMS with reports of cough and headache x2 days. Pt denies SOB at triage. Pt O2 100% on RA. Pt very talkative and not able to stay still.

## 2016-09-14 ENCOUNTER — Emergency Department
Admission: EM | Admit: 2016-09-14 | Discharge: 2016-09-14 | Disposition: A | Discharge status: Home / Self Care | Payer: Self-pay | Attending: Emergency Medicine | Admitting: Emergency Medicine

## 2016-09-14 ENCOUNTER — Emergency Department: Payer: Self-pay

## 2016-09-14 ENCOUNTER — Encounter: Payer: Self-pay | Admitting: Emergency Medicine

## 2016-09-14 DIAGNOSIS — Z79899 Other long term (current) drug therapy: Secondary | ICD-10-CM | POA: Insufficient documentation

## 2016-09-14 DIAGNOSIS — R0781 Pleurodynia: Secondary | ICD-10-CM

## 2016-09-14 DIAGNOSIS — R0789 Other chest pain: Secondary | ICD-10-CM | POA: Insufficient documentation

## 2016-09-14 DIAGNOSIS — F17210 Nicotine dependence, cigarettes, uncomplicated: Secondary | ICD-10-CM | POA: Insufficient documentation

## 2016-09-14 DIAGNOSIS — I10 Essential (primary) hypertension: Secondary | ICD-10-CM | POA: Insufficient documentation

## 2016-09-14 DIAGNOSIS — R52 Pain, unspecified: Secondary | ICD-10-CM

## 2016-09-14 MED ORDER — ACETAMINOPHEN 500 MG PO TABS
1000.0000 mg | ORAL_TABLET | Freq: Once | ORAL | Status: AC
  Administered 2016-09-14: 1000 mg via ORAL
  Filled 2016-09-14: qty 2

## 2016-09-14 MED ORDER — OXYCODONE HCL 5 MG PO TABS
5.0000 mg | ORAL_TABLET | Freq: Once | ORAL | Status: AC
  Administered 2016-09-14: 5 mg via ORAL
  Filled 2016-09-14: qty 1

## 2016-09-14 MED ORDER — NAPROXEN 500 MG PO TABS: 500.0000 mg | ORAL_TABLET | Freq: Two times a day (BID) | ORAL | 0 refills | Status: DC

## 2016-09-14 NOTE — ED Notes (Signed)
ED Provider at bedside. 

## 2016-09-14 NOTE — ED Triage Notes (Signed)
Pt fell 2 weeks ago and hit lower right ribs. Has had pain same area with increase in pain with deep breathing. Pt sneezed at 1330 today and pain got worse. Pain 8/10 today.

## 2016-09-14 NOTE — ED Provider Notes (Signed)
[facility] Emergency Department Provider Note  ____________________________________________  Time seen: Approximately 5:37 PM  I have reviewed the triage vital signs and the nursing notes.   HISTORY  Chief Complaint Rib Injury   HPI Kevin Garrett is a 41 y.o. male the history of hypertension who presents for evaluation of right rib cage pain. Patient reports 2 weeks ago he fell while coming out of the tub and hit the right side of his chest wall onto the lateral edge of the tub. He has had continuous pain on the right rib cage that is worse with movement and inspiration. Today he reports that he had a very hard sneeze and since then the pain has gotten worse currently complaining of severe sharp pain located in the right lateral lower rib cage area constant and non radiating. No shortness of breath. He has had a dry cough for the last few days but no fever, chills, nausea, vomiting.  Past Medical History:  Diagnosis Date  . Anxiety   . Hypertension     There are no active problems to display for this patient.   History reviewed. No pertinent surgical history.  Prior to Admission medications   Medication Sig Start Date End Date Taking? Authorizing Provider  amLODipine (NORVASC) 10 MG tablet Take 10 mg by mouth daily.    [provider]  citalopram (CELEXA) 20 MG tablet Take 30 mg by mouth daily.    [provider]  loratadine (CLARITIN) 10 MG tablet Take 1 tablet (10 mg total) by mouth daily. 12/21/15   Hagler, Jami L, PA-C  meclizine (ANTIVERT) 12.5 MG tablet Take 1 tablet (12.5 mg total) by mouth 3 (three) times daily as needed for dizziness or nausea. 09/30/15   Daymon Larsen, MD  naproxen (NAPROSYN) 500 MG tablet Take 1 tablet (500 mg total) by mouth 2 (two) times daily with a meal. 09/14/16 09/14/17  Alfred Levins, [place], MD  promethazine-dextromethorphan (PROMETHAZINE-DM) 6.25-15 MG/5ML syrup Take 5 mLs by mouth 4 (four) times daily  as needed for cough. 12/21/15   Hagler, Jami L, PA-C  tamsulosin (FLOMAX) 0.4 MG CAPS capsule Take 0.4 mg by mouth daily.    [provider]    Allergies Patient has no known allergies.  History reviewed. No pertinent family history.  Social History Social History  Substance Use Topics  . Smoking status: Current Some Day Smoker    Packs/day: 0.50    Types: Cigarettes  . Smokeless tobacco: Never Used  . Alcohol use No    Review of Systems  Constitutional: Negative for fever. Eyes: Negative for visual changes. ENT: Negative for sore throat. Neck: No neck pain  Cardiovascular: Negative for chest pain. Respiratory: Negative for shortness of breath. Gastrointestinal: Negative for abdominal pain, vomiting or diarrhea. Genitourinary: Negative for dysuria. Musculoskeletal: Negative for back pain. + R rib cage pain Skin: Negative for rash. Neurological: Negative for headaches, weakness or numbness. Psych: No SI or HI  ____________________________________________   PHYSICAL EXAM:  VITAL SIGNS: ED Triage Vitals  Enc Vitals Group     BP 09/14/16 1656 (!) 176/101     Pulse Rate 09/14/16 1651 82     Resp --      Temp 09/14/16 1651 97.9 F (36.6 C)     Temp Source 09/14/16 1651 Oral     SpO2 --      Weight 09/14/16 1653 295 lb (133.8 kg)     Height 09/14/16 1653 5\' 9"  (1.753 m)  Head Circumference --      Peak Flow --      Pain Score 09/14/16 1650 9     Pain Loc --      Pain Edu? --      Excl. in [place]? --     Constitutional: Alert and oriented. Well appearing and in no apparent distress. HEENT:      Head: Normocephalic and atraumatic.         Eyes: Conjunctivae are normal. Sclera is non-icteric.       Mouth/Throat: Mucous membranes are moist.       Neck: Supple with no signs of meningismus. Cardiovascular: Regular rate and rhythm. No murmurs, gallops, or rubs. 2+ symmetrical distal pulses are present in all extremities. No JVD. Chest wall: ttp over the R  lateral lower rib with no deformity or bruising, no crepitus Respiratory: Normal respiratory effort. Lungs are clear to auscultation bilaterally. No wheezes, crackles, or rhonchi.  Gastrointestinal: Soft, non tender, and non distended with positive bowel sounds. No rebound or guarding. Musculoskeletal: Nontender with normal range of motion in all extremities. No edema, cyanosis, or erythema of extremities. Neurologic: Normal speech and language. Face is symmetric. Moving all extremities. No gross focal neurologic deficits are appreciated. Skin: Skin is warm, dry and intact. No rash noted. Psychiatric: Mood and affect are normal. Speech and behavior are normal.  ____________________________________________   LABS (all labs ordered are listed, but only abnormal results are displayed)  Labs Reviewed - No data to display ____________________________________________  EKG  none  ____________________________________________  RADIOLOGY  CXR: negative  ____________________________________________   PROCEDURES  Procedure(s) performed: None Procedures Critical Care performed:  None ____________________________________________   INITIAL IMPRESSION / ASSESSMENT AND PLAN / ED COURSE  41 y.o. male the history of hypertension who presents for evaluation of right rib cage pain after falling and hitting his chest onto a tub. Patient is well-appearing, normal vital signs, lungs are clear to auscultation with breath sounds bilaterally, has tenderness to palpation over the right lower lateral rib with no bruising or step-offs. Patient is in no respiratory distress. We'll order a chest x-ray of the chest with rib views to rule out rib fracture and make sure patient does not have a pneumothorax. We'll treat his pain with by mouth meds.    _________________________ 6:21 PM on 09/14/2016 -----------------------------------------  Patient sleeping comfortably with significant improvement of his pain  after receiving Tylenol and 1 oxycodone. He is going to be discharged home on naproxen. Chest x-ray negative for rib fracture, pneumothorax, pneumonia.  Pertinent labs & imaging results that were available during my care of the patient were reviewed by me and considered in my medical decision making (see chart for details).    ____________________________________________   FINAL CLINICAL IMPRESSION(S) / ED DIAGNOSES  Final diagnoses:  Rib pain on right side      NEW MEDICATIONS STARTED DURING THIS VISIT:  New Prescriptions   NAPROXEN (NAPROSYN) 500 MG TABLET    Take 1 tablet (500 mg total) by mouth 2 (two) times daily with a meal.     Note:  This document was prepared using Dragon voice recognition software and may include unintentional dictation errors.    Rudene Re, MD 09/14/16 [facility]

## 2016-10-14 ENCOUNTER — Inpatient Hospital Stay (HOSPITAL_COMMUNITY)
Admission: EM | Admit: 2016-10-14 | Discharge: 2016-10-23 | DRG: 493 | Disposition: A | Discharge status: Home Health | Payer: Medicaid Other | Attending: Orthopedic Surgery | Admitting: Orthopedic Surgery

## 2016-10-14 ENCOUNTER — Encounter (HOSPITAL_COMMUNITY): Payer: Self-pay

## 2016-10-14 ENCOUNTER — Emergency Department (HOSPITAL_COMMUNITY): Payer: Medicaid Other

## 2016-10-14 DIAGNOSIS — Y92007 Garden or yard of unspecified non-institutional (private) residence as the place of occurrence of the external cause: Secondary | ICD-10-CM

## 2016-10-14 DIAGNOSIS — R52 Pain, unspecified: Secondary | ICD-10-CM

## 2016-10-14 DIAGNOSIS — S0181XA Laceration without foreign body of other part of head, initial encounter: Secondary | ICD-10-CM | POA: Diagnosis present

## 2016-10-14 DIAGNOSIS — E119 Type 2 diabetes mellitus without complications: Secondary | ICD-10-CM

## 2016-10-14 DIAGNOSIS — E8889 Other specified metabolic disorders: Secondary | ICD-10-CM | POA: Diagnosis present

## 2016-10-14 DIAGNOSIS — I10 Essential (primary) hypertension: Secondary | ICD-10-CM | POA: Diagnosis present

## 2016-10-14 DIAGNOSIS — S0101XA Laceration without foreign body of scalp, initial encounter: Secondary | ICD-10-CM | POA: Diagnosis present

## 2016-10-14 DIAGNOSIS — Z6841 Body Mass Index (BMI) 40.0 and over, adult: Secondary | ICD-10-CM

## 2016-10-14 DIAGNOSIS — T148XXA Other injury of unspecified body region, initial encounter: Secondary | ICD-10-CM

## 2016-10-14 DIAGNOSIS — Z9119 Patient's noncompliance with other medical treatment and regimen: Secondary | ICD-10-CM

## 2016-10-14 DIAGNOSIS — F17200 Nicotine dependence, unspecified, uncomplicated: Secondary | ICD-10-CM | POA: Diagnosis present

## 2016-10-14 DIAGNOSIS — S42352A Displaced comminuted fracture of shaft of humerus, left arm, initial encounter for closed fracture: Secondary | ICD-10-CM

## 2016-10-14 DIAGNOSIS — F17210 Nicotine dependence, cigarettes, uncomplicated: Secondary | ICD-10-CM | POA: Diagnosis present

## 2016-10-14 DIAGNOSIS — G4730 Sleep apnea, unspecified: Secondary | ICD-10-CM | POA: Diagnosis present

## 2016-10-14 DIAGNOSIS — F419 Anxiety disorder, unspecified: Secondary | ICD-10-CM | POA: Diagnosis present

## 2016-10-14 DIAGNOSIS — S01512A Laceration without foreign body of oral cavity, initial encounter: Secondary | ICD-10-CM | POA: Diagnosis present

## 2016-10-14 DIAGNOSIS — Z419 Encounter for procedure for purposes other than remedying health state, unspecified: Secondary | ICD-10-CM

## 2016-10-14 DIAGNOSIS — S42102A Fracture of unspecified part of scapula, left shoulder, initial encounter for closed fracture: Secondary | ICD-10-CM | POA: Diagnosis present

## 2016-10-14 DIAGNOSIS — E559 Vitamin D deficiency, unspecified: Secondary | ICD-10-CM | POA: Diagnosis present

## 2016-10-14 DIAGNOSIS — N179 Acute kidney failure, unspecified: Secondary | ICD-10-CM | POA: Diagnosis present

## 2016-10-14 DIAGNOSIS — R42 Dizziness and giddiness: Secondary | ICD-10-CM | POA: Diagnosis not present

## 2016-10-14 DIAGNOSIS — S42362A Displaced segmental fracture of shaft of humerus, left arm, initial encounter for closed fracture: Secondary | ICD-10-CM | POA: Diagnosis present

## 2016-10-14 DIAGNOSIS — Y9389 Activity, other specified: Secondary | ICD-10-CM

## 2016-10-14 DIAGNOSIS — D62 Acute posthemorrhagic anemia: Secondary | ICD-10-CM | POA: Diagnosis present

## 2016-10-14 DIAGNOSIS — Z79899 Other long term (current) drug therapy: Secondary | ICD-10-CM

## 2016-10-14 HISTORY — DX: Sleep apnea, unspecified: G47.30

## 2016-10-14 HISTORY — DX: Type 2 diabetes mellitus without complications: E11.9

## 2016-10-14 HISTORY — DX: Nicotine dependence, unspecified, uncomplicated: F17.200

## 2016-10-14 HISTORY — DX: Vitamin D deficiency, unspecified: E55.9

## 2016-10-14 LAB — I-STAT CHEM 8, ED
BUN: 18 mg/dL (ref 6–20)
CREATININE: 2.2 mg/dL — AB (ref 0.61–1.24)
Calcium, Ion: 1.08 mmol/L — ABNORMAL LOW (ref 1.15–1.40)
Chloride: 102 mmol/L (ref 101–111)
GLUCOSE: 227 mg/dL — AB (ref 65–99)
HEMATOCRIT: 40 % (ref 39.0–52.0)
Hemoglobin: 13.6 g/dL (ref 13.0–17.0)
POTASSIUM: 3.2 mmol/L — AB (ref 3.5–5.1)
Sodium: 141 mmol/L (ref 135–145)
TCO2: 27 mmol/L (ref 0–100)

## 2016-10-14 LAB — COMPREHENSIVE METABOLIC PANEL
ALT: 55 U/L (ref 17–63)
AST: 75 U/L — AB (ref 15–41)
Albumin: 3.5 g/dL (ref 3.5–5.0)
Alkaline Phosphatase: 54 U/L (ref 38–126)
Anion gap: 8 (ref 5–15)
BILIRUBIN TOTAL: 1.2 mg/dL (ref 0.3–1.2)
BUN: 16 mg/dL (ref 6–20)
CO2: 25 mmol/L (ref 22–32)
CREATININE: 2.29 mg/dL — AB (ref 0.61–1.24)
Calcium: 8.5 mg/dL — ABNORMAL LOW (ref 8.9–10.3)
Chloride: 104 mmol/L (ref 101–111)
GFR, EST AFRICAN AMERICAN: 39 mL/min — AB (ref >60)
GFR, EST NON AFRICAN AMERICAN: 34 mL/min — AB (ref >60)
Glucose, Bld: 225 mg/dL — ABNORMAL HIGH (ref 65–99)
Potassium: 3.4 mmol/L — ABNORMAL LOW (ref 3.5–5.1)
Sodium: 137 mmol/L (ref 135–145)
TOTAL PROTEIN: 6.6 g/dL (ref 6.5–8.1)

## 2016-10-14 LAB — CBC
HCT: 37.5 % — ABNORMAL LOW (ref 39.0–52.0)
Hemoglobin: 12.9 g/dL — ABNORMAL LOW (ref 13.0–17.0)
MCH: 29.2 pg (ref 26.0–34.0)
MCHC: 34.4 g/dL (ref 30.0–36.0)
MCV: 84.8 fL (ref 78.0–100.0)
PLATELETS: 201 10*3/uL (ref 150–400)
RBC: 4.42 MIL/uL (ref 4.22–5.81)
RDW: 14.2 % (ref 11.5–15.5)
WBC: 12.3 10*3/uL — AB (ref 4.0–10.5)

## 2016-10-14 MED ORDER — LIDOCAINE-EPINEPHRINE (PF) 2 %-1:200000 IJ SOLN
20.0000 mL | Freq: Once | INTRAMUSCULAR | Status: AC
  Administered 2016-10-14: 20 mL
  Filled 2016-10-14: qty 20

## 2016-10-14 MED ORDER — TETANUS-DIPHTH-ACELL PERTUSSIS 5-2.5-18.5 LF-MCG/0.5 IM SUSP
0.5000 mL | Freq: Once | INTRAMUSCULAR | Status: AC
  Administered 2016-10-14: 0.5 mL via INTRAMUSCULAR
  Filled 2016-10-14: qty 0.5

## 2016-10-14 MED ORDER — HYDROMORPHONE HCL 1 MG/ML IJ SOLN
1.0000 mg | Freq: Once | INTRAMUSCULAR | Status: AC
  Administered 2016-10-14: 1 mg via INTRAVENOUS
  Filled 2016-10-14: qty 1

## 2016-10-14 MED ORDER — SODIUM CHLORIDE 0.9 % IV BOLUS (SEPSIS)
1000.0000 mL | Freq: Once | INTRAVENOUS | Status: AC
  Administered 2016-10-14: 1000 mL via INTRAVENOUS

## 2016-10-14 MED ORDER — FENTANYL CITRATE (PF) 100 MCG/2ML IJ SOLN
100.0000 ug | Freq: Once | INTRAMUSCULAR | Status: AC
  Administered 2016-10-14: 100 ug via INTRAVENOUS
  Filled 2016-10-14: qty 2

## 2016-10-14 NOTE — ED Notes (Signed)
XR at bedside

## 2016-10-14 NOTE — ED Notes (Addendum)
Pt verbally aggressive stating "I will walk the fuck out of this hospital." Pt visibly angry he is not able to drink water and told this nurse "Shutup, get the fuck out of my face. I want water now." Attempted therapeutic communication to explain plan of care to pt and reasoning behind actions. Pt continues to swear and appear agitated.
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
## 2016-10-14 NOTE — ED Notes (Signed)
Suture cart and lidocaine at bedside.  

## 2016-10-14 NOTE — ED Notes (Signed)
ED Provider at bedside. 

## 2016-10-14 NOTE — ED Notes (Signed)
Pt given ice water per Dr.Liu. Pt continues to be verbally aggressive with staff. Stating "you aren't fucking listening to me! Get me water now!" Despite attempting to explain to pt that our priority is controlling and assessing the bleeding on his head wound, pt continues to act out and appear agitated. Security paged and aware that pt has potential to escalate.
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
## 2016-10-14 NOTE — ED Notes (Signed)
Patient transported to CT 

## 2016-10-15 ENCOUNTER — Emergency Department (HOSPITAL_COMMUNITY): Payer: Medicaid Other

## 2016-10-15 DIAGNOSIS — Y9389 Activity, other specified: Secondary | ICD-10-CM | POA: Diagnosis not present

## 2016-10-15 DIAGNOSIS — S42102A Fracture of unspecified part of scapula, left shoulder, initial encounter for closed fracture: Secondary | ICD-10-CM | POA: Diagnosis present

## 2016-10-15 DIAGNOSIS — S0181XA Laceration without foreign body of other part of head, initial encounter: Secondary | ICD-10-CM | POA: Diagnosis present

## 2016-10-15 DIAGNOSIS — Z79899 Other long term (current) drug therapy: Secondary | ICD-10-CM | POA: Diagnosis not present

## 2016-10-15 DIAGNOSIS — E559 Vitamin D deficiency, unspecified: Secondary | ICD-10-CM | POA: Diagnosis present

## 2016-10-15 DIAGNOSIS — R42 Dizziness and giddiness: Secondary | ICD-10-CM | POA: Diagnosis not present

## 2016-10-15 DIAGNOSIS — S42352A Displaced comminuted fracture of shaft of humerus, left arm, initial encounter for closed fracture: Secondary | ICD-10-CM | POA: Diagnosis present

## 2016-10-15 DIAGNOSIS — F419 Anxiety disorder, unspecified: Secondary | ICD-10-CM | POA: Diagnosis present

## 2016-10-15 DIAGNOSIS — S01512A Laceration without foreign body of oral cavity, initial encounter: Secondary | ICD-10-CM | POA: Diagnosis present

## 2016-10-15 DIAGNOSIS — Z9119 Patient's noncompliance with other medical treatment and regimen: Secondary | ICD-10-CM | POA: Diagnosis not present

## 2016-10-15 DIAGNOSIS — I10 Essential (primary) hypertension: Secondary | ICD-10-CM | POA: Diagnosis present

## 2016-10-15 DIAGNOSIS — S42362A Displaced segmental fracture of shaft of humerus, left arm, initial encounter for closed fracture: Secondary | ICD-10-CM | POA: Diagnosis present

## 2016-10-15 DIAGNOSIS — S0101XA Laceration without foreign body of scalp, initial encounter: Secondary | ICD-10-CM | POA: Diagnosis present

## 2016-10-15 DIAGNOSIS — R52 Pain, unspecified: Secondary | ICD-10-CM | POA: Diagnosis present

## 2016-10-15 DIAGNOSIS — N179 Acute kidney failure, unspecified: Secondary | ICD-10-CM | POA: Diagnosis present

## 2016-10-15 DIAGNOSIS — Z6841 Body Mass Index (BMI) 40.0 and over, adult: Secondary | ICD-10-CM | POA: Diagnosis not present

## 2016-10-15 DIAGNOSIS — D62 Acute posthemorrhagic anemia: Secondary | ICD-10-CM | POA: Diagnosis present

## 2016-10-15 DIAGNOSIS — F17210 Nicotine dependence, cigarettes, uncomplicated: Secondary | ICD-10-CM | POA: Diagnosis present

## 2016-10-15 DIAGNOSIS — Y92007 Garden or yard of unspecified non-institutional (private) residence as the place of occurrence of the external cause: Secondary | ICD-10-CM | POA: Diagnosis not present

## 2016-10-15 DIAGNOSIS — E8889 Other specified metabolic disorders: Secondary | ICD-10-CM | POA: Diagnosis present

## 2016-10-15 DIAGNOSIS — G4730 Sleep apnea, unspecified: Secondary | ICD-10-CM | POA: Diagnosis present

## 2016-10-15 DIAGNOSIS — E119 Type 2 diabetes mellitus without complications: Secondary | ICD-10-CM | POA: Diagnosis present

## 2016-10-15 LAB — GLUCOSE, CAPILLARY
GLUCOSE-CAPILLARY: 144 mg/dL — AB (ref 65–99)
GLUCOSE-CAPILLARY: 180 mg/dL — AB (ref 65–99)
Glucose-Capillary: 159 mg/dL — ABNORMAL HIGH (ref 65–99)
Glucose-Capillary: 157 mg/dL — ABNORMAL HIGH (ref 65–99)
Glucose-Capillary: 119 mg/dL — ABNORMAL HIGH (ref 65–99)

## 2016-10-15 LAB — RENAL FUNCTION PANEL
Albumin: 3.5 g/dL (ref 3.5–5.0)
Anion gap: 7 (ref 5–15)
BUN: 14 mg/dL (ref 6–20)
CHLORIDE: 105 mmol/L (ref 101–111)
CO2: 24 mmol/L (ref 22–32)
CREATININE: 1.56 mg/dL — AB (ref 0.61–1.24)
Calcium: 8.1 mg/dL — ABNORMAL LOW (ref 8.9–10.3)
GFR calc Af Amer: >60 mL/min (ref >60)
GFR calc non Af Amer: 54 mL/min — ABNORMAL LOW (ref >60)
GLUCOSE: 161 mg/dL — AB (ref 65–99)
POTASSIUM: 4 mmol/L (ref 3.5–5.1)
Phosphorus: 3.6 mg/dL (ref 2.5–4.6)
Sodium: 136 mmol/L (ref 135–145)

## 2016-10-15 LAB — URINALYSIS, ROUTINE W REFLEX MICROSCOPIC
BILIRUBIN URINE: NEGATIVE
Bilirubin Urine: NEGATIVE
GLUCOSE, UA: NEGATIVE mg/dL
GLUCOSE, UA: 100 mg/dL — AB
HGB URINE DIPSTICK: NEGATIVE
KETONES UR: 15 mg/dL — AB
Ketones, ur: NEGATIVE mg/dL
Leukocytes, UA: NEGATIVE
Leukocytes, UA: NEGATIVE
Nitrite: NEGATIVE
Nitrite: NEGATIVE
PH: 5 (ref 5.0–8.0)
PROTEIN: 30 mg/dL — AB
Protein, ur: NEGATIVE mg/dL
SPECIFIC GRAVITY, URINE: 1.015 (ref 1.005–1.030)
Specific Gravity, Urine: 1.015 (ref 1.005–1.030)
pH: 5.5 (ref 5.0–8.0)

## 2016-10-15 LAB — ETHANOL

## 2016-10-15 LAB — CBC
HEMATOCRIT: 34.7 % — AB (ref 39.0–52.0)
Hemoglobin: 11.7 g/dL — ABNORMAL LOW (ref 13.0–17.0)
MCH: 29 pg (ref 26.0–34.0)
MCHC: 33.7 g/dL (ref 30.0–36.0)
MCV: 85.9 fL (ref 78.0–100.0)
PLATELETS: 172 10*3/uL (ref 150–400)
RBC: 4.04 MIL/uL — ABNORMAL LOW (ref 4.22–5.81)
RDW: 14.9 % (ref 11.5–15.5)
WBC: 8.3 10*3/uL (ref 4.0–10.5)

## 2016-10-15 LAB — URINALYSIS, MICROSCOPIC (REFLEX)

## 2016-10-15 LAB — SURGICAL PCR SCREEN
MRSA, PCR: NEGATIVE
Staphylococcus aureus: NEGATIVE

## 2016-10-15 MED ORDER — OXYCODONE-ACETAMINOPHEN 5-325 MG PO TABS
1.0000 | ORAL_TABLET | Freq: Four times a day (QID) | ORAL | Status: DC | PRN
  Administered 2016-10-15 – 2016-10-16 (×3): 2 via ORAL
  Filled 2016-10-15 (×3): qty 2

## 2016-10-15 MED ORDER — TRAMADOL HCL 50 MG PO TABS
50.0000 mg | ORAL_TABLET | Freq: Three times a day (TID) | ORAL | Status: DC | PRN
  Administered 2016-10-15 – 2016-10-22 (×10): 100 mg via ORAL
  Filled 2016-10-15 (×10): qty 2

## 2016-10-15 MED ORDER — HYDROCODONE-ACETAMINOPHEN 5-325 MG PO TABS
1.0000 | ORAL_TABLET | ORAL | Status: DC | PRN
  Administered 2016-10-15: 2 via ORAL
  Filled 2016-10-15 (×2): qty 2

## 2016-10-15 MED ORDER — ENOXAPARIN SODIUM 40 MG/0.4ML ~~LOC~~ SOLN
40.0000 mg | SUBCUTANEOUS | Status: DC
  Administered 2016-10-15 – 2016-10-23 (×9): 40 mg via SUBCUTANEOUS
  Filled 2016-10-15 (×9): qty 0.4

## 2016-10-15 MED ORDER — SODIUM CHLORIDE 0.9 % IV SOLN
INTRAVENOUS | Status: AC
  Administered 2016-10-15: 02:00:00 via INTRAVENOUS

## 2016-10-15 MED ORDER — MAGNESIUM CITRATE PO SOLN: 1.0000 | Freq: Once | ORAL | Status: DC | PRN

## 2016-10-15 MED ORDER — INSULIN ASPART 100 UNIT/ML ~~LOC~~ SOLN
0.0000 [IU] | Freq: Three times a day (TID) | SUBCUTANEOUS | Status: DC
  Administered 2016-10-16 (×2): 3 [IU] via SUBCUTANEOUS
  Administered 2016-10-16 – 2016-10-18 (×3): 4 [IU] via SUBCUTANEOUS
  Administered 2016-10-19 – 2016-10-23 (×8): 3 [IU] via SUBCUTANEOUS

## 2016-10-15 MED ORDER — METHOCARBAMOL 500 MG PO TABS
500.0000 mg | ORAL_TABLET | Freq: Four times a day (QID) | ORAL | Status: DC | PRN
  Administered 2016-10-15 (×2): 500 mg via ORAL
  Administered 2016-10-16: 1000 mg via ORAL
  Administered 2016-10-16: 500 mg via ORAL
  Administered 2016-10-17 – 2016-10-21 (×10): 1000 mg via ORAL
  Administered 2016-10-21 (×2): 500 mg via ORAL
  Administered 2016-10-22 – 2016-10-23 (×6): 1000 mg via ORAL
  Filled 2016-10-15 (×5): qty 2
  Filled 2016-10-15 (×2): qty 1
  Filled 2016-10-15 (×2): qty 2
  Filled 2016-10-15: qty 1
  Filled 2016-10-15 (×2): qty 2
  Filled 2016-10-15: qty 1
  Filled 2016-10-15: qty 2
  Filled 2016-10-15: qty 1
  Filled 2016-10-15 (×7): qty 2

## 2016-10-15 MED ORDER — ORAL CARE MOUTH RINSE
15.0000 mL | Freq: Two times a day (BID) | OROMUCOSAL | Status: DC
  Administered 2016-10-15 – 2016-10-21 (×11): 15 mL via OROMUCOSAL

## 2016-10-15 MED ORDER — ACETAMINOPHEN 325 MG PO TABS: 650.0000 mg | ORAL_TABLET | Freq: Four times a day (QID) | ORAL | Status: DC | PRN

## 2016-10-15 MED ORDER — POTASSIUM CHLORIDE IN NACL 20-0.9 MEQ/L-% IV SOLN
INTRAVENOUS | Status: DC
  Administered 2016-10-15: 13:00:00 via INTRAVENOUS
  Administered 2016-10-16: 125 mL via INTRAVENOUS
  Administered 2016-10-16 – 2016-10-22 (×8): via INTRAVENOUS
  Filled 2016-10-15 (×12): qty 1000

## 2016-10-15 MED ORDER — INSULIN ASPART 100 UNIT/ML ~~LOC~~ SOLN: 0.0000 [IU] | Freq: Every day | SUBCUTANEOUS | Status: DC

## 2016-10-15 MED ORDER — ACETAMINOPHEN 650 MG RE SUPP: 650.0000 mg | Freq: Four times a day (QID) | RECTAL | Status: DC | PRN

## 2016-10-15 MED ORDER — POLYETHYLENE GLYCOL 3350 17 G PO PACK
17.0000 g | PACK | Freq: Every day | ORAL | Status: DC
  Administered 2016-10-15 – 2016-10-23 (×7): 17 g via ORAL
  Filled 2016-10-15 (×8): qty 1

## 2016-10-15 MED ORDER — HYDROMORPHONE HCL 1 MG/ML IJ SOLN
0.5000 mg | INTRAMUSCULAR | Status: DC | PRN
  Administered 2016-10-15: 1 mg via INTRAVENOUS
  Filled 2016-10-15: qty 1

## 2016-10-15 MED ORDER — DOCUSATE SODIUM 100 MG PO CAPS
100.0000 mg | ORAL_CAPSULE | Freq: Two times a day (BID) | ORAL | Status: DC
Start: 2016-10-15 — End: 2016-10-23
  Administered 2016-10-15 – 2016-10-23 (×14): 100 mg via ORAL
  Filled 2016-10-15 (×14): qty 1

## 2016-10-15 MED ORDER — HYDROMORPHONE HCL 1 MG/ML IJ SOLN
1.0000 mg | INTRAMUSCULAR | Status: DC | PRN
  Administered 2016-10-15: 1 mg via INTRAVENOUS
  Filled 2016-10-15: qty 1

## 2016-10-15 MED ORDER — CITALOPRAM HYDROBROMIDE 20 MG PO TABS
30.0000 mg | ORAL_TABLET | Freq: Every day | ORAL | Status: DC
  Administered 2016-10-16 – 2016-10-23 (×7): 30 mg via ORAL
  Filled 2016-10-15 (×7): qty 1

## 2016-10-15 MED ORDER — ENSURE ENLIVE PO LIQD
237.0000 mL | Freq: Two times a day (BID) | ORAL | Status: DC
  Administered 2016-10-15 – 2016-10-23 (×12): 237 mL via ORAL

## 2016-10-15 MED ORDER — AMLODIPINE BESYLATE 10 MG PO TABS
10.0000 mg | ORAL_TABLET | Freq: Every day | ORAL | Status: DC
  Administered 2016-10-15: 10 mg via ORAL
  Filled 2016-10-15: qty 1

## 2016-10-15 MED ORDER — INSULIN ASPART 100 UNIT/ML ~~LOC~~ SOLN
0.0000 [IU] | Freq: Three times a day (TID) | SUBCUTANEOUS | Status: DC
  Administered 2016-10-15: 3 [IU] via SUBCUTANEOUS

## 2016-10-15 MED ORDER — MORPHINE SULFATE (PF) 4 MG/ML IV SOLN
1.0000 mg | INTRAVENOUS | Status: DC | PRN
  Administered 2016-10-15 – 2016-10-16 (×5): 2 mg via INTRAVENOUS
  Filled 2016-10-15 (×5): qty 1

## 2016-10-15 MED ORDER — ONDANSETRON HCL 4 MG/2ML IJ SOLN
4.0000 mg | Freq: Three times a day (TID) | INTRAMUSCULAR | Status: AC | PRN
  Administered 2016-10-15: 4 mg via INTRAVENOUS
  Filled 2016-10-15: qty 2

## 2016-10-15 NOTE — Consult Note (Signed)
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 diet controlled and is on no medications   Sliding scale   On  Celexa PTA   Resume     - DVT/PE prophylaxis:  Lovenox   - Metabolic Bone Disease:  Check vitamin D levels  - Activity:  NWB Left Upper Extremity    - FEN/GI prophylaxis/Foley/Lines:  Soft diet, carb modified  Ensure supplement   IVF: NS w/ 20 k+ @ 125cc/hr   Continue to monitor  No foley needed   Overhead frame  - Dispo:  OR Tuesday for ORIF L humerus  ENT consult for tongue lac, L periorbital hematoma and ? L mandibular condyle subluxation    Jari Pigg, PA-C [facility] [phone] (P) 10/15/2016, 12:24 PM

## 2016-10-15 NOTE — ED Notes (Signed)
Pt O2 sats decreased to 89% on RA after dilaudid administration, pt placed on 2L [place]. O2 sat increased to 95%.

## 2016-10-15 NOTE — ED Notes (Signed)
Ortho tech called for pt splint and sling immobilizer.
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
## 2016-10-15 NOTE — Progress Notes (Signed)
Patient placed on CPAP for QHS and doing well. 15 cmH20 on with 2L O2 bleed in. Patient resting well now and some abrasions on the face cover by Rn with some non stick gauze. Patient has no complaints of position of CPAP mask.

## 2016-10-15 NOTE — ED Notes (Signed)
Patient transported to CT 

## 2016-10-15 NOTE — ED Notes (Signed)
Pt abrasions dressed with bacitracin and sterile gauze.

## 2016-10-15 NOTE — Progress Notes (Signed)
Pt arrived on unit at 0300. He was very cooperative, although still in pain. Ortho Tech here and placed short arm splint and sling on Left arm. Pt placed on O2 due to short periods of apnea with sats falling into mid 80's. He states he has a CPAP at home that is broken. Abrasions on shoulders, legs, and hand cleaned. Tongue has three areas of small lacerations.

## 2016-10-16 LAB — COMPREHENSIVE METABOLIC PANEL
ALBUMIN: 3.2 g/dL — AB (ref 3.5–5.0)
ALK PHOS: 41 U/L (ref 38–126)
ALT: 38 U/L (ref 17–63)
ANION GAP: 7 (ref 5–15)
AST: 48 U/L — ABNORMAL HIGH (ref 15–41)
BUN: 11 mg/dL (ref 6–20)
CALCIUM: 8.3 mg/dL — AB (ref 8.9–10.3)
CHLORIDE: 105 mmol/L (ref 101–111)
CO2: 25 mmol/L (ref 22–32)
Creatinine, Ser: 1.12 mg/dL (ref 0.61–1.24)
GFR calc non Af Amer: >60 mL/min (ref >60)
Glucose, Bld: 127 mg/dL — ABNORMAL HIGH (ref 65–99)
Potassium: 4.4 mmol/L (ref 3.5–5.1)
Sodium: 137 mmol/L (ref 135–145)
Total Bilirubin: 1 mg/dL (ref 0.3–1.2)
Total Protein: 6.3 g/dL — ABNORMAL LOW (ref 6.5–8.1)

## 2016-10-16 LAB — GLUCOSE, CAPILLARY
GLUCOSE-CAPILLARY: 124 mg/dL — AB (ref 65–99)
GLUCOSE-CAPILLARY: 126 mg/dL — AB (ref 65–99)
GLUCOSE-CAPILLARY: 131 mg/dL — AB (ref 65–99)
GLUCOSE-CAPILLARY: 179 mg/dL — AB (ref 65–99)

## 2016-10-16 LAB — HEMOGLOBIN A1C
HEMOGLOBIN A1C: 6.2 % — AB (ref 4.8–5.6)
MEAN PLASMA GLUCOSE: 131 mg/dL

## 2016-10-16 LAB — PROTIME-INR
INR: 1.17
PROTHROMBIN TIME: 15 s (ref 11.4–15.2)

## 2016-10-16 LAB — CBC
HEMATOCRIT: 30.9 % — AB (ref 39.0–52.0)
Hemoglobin: 10.9 g/dL — ABNORMAL LOW (ref 13.0–17.0)
MCH: 30 pg (ref 26.0–34.0)
MCHC: 35.3 g/dL (ref 30.0–36.0)
MCV: 85.1 fL (ref 78.0–100.0)
Platelets: 149 10*3/uL — ABNORMAL LOW (ref 150–400)
RBC: 3.63 MIL/uL — ABNORMAL LOW (ref 4.22–5.81)
RDW: 14.8 % (ref 11.5–15.5)
WBC: 7.2 10*3/uL (ref 4.0–10.5)

## 2016-10-16 LAB — HIV ANTIBODY (ROUTINE TESTING W REFLEX): HIV Screen 4th Generation wRfx: NONREACTIVE

## 2016-10-16 MED ORDER — ACETAMINOPHEN 10 MG/ML IV SOLN
1000.0000 mg | Freq: Four times a day (QID) | INTRAVENOUS | Status: AC
  Administered 2016-10-16 – 2016-10-17 (×2): 1000 mg via INTRAVENOUS
  Filled 2016-10-16 (×2): qty 100

## 2016-10-16 MED ORDER — CEFAZOLIN SODIUM-DEXTROSE 2-4 GM/100ML-% IV SOLN
2.0000 g | Freq: Once | INTRAVENOUS | Status: AC
  Administered 2016-10-17: 2 g via INTRAVENOUS
  Administered 2016-10-17: 3 g via INTRAVENOUS
  Filled 2016-10-16 (×2): qty 100

## 2016-10-16 NOTE — Progress Notes (Signed)
Pt removed CPAP stating that it makes him claustrophobic. When sleeping he has 10 sec episodes of apnea and his sats drop to low 80's. Pt sat in chair early this morning and his sats remained above 95% with nasal cannula.
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
## 2016-10-17 ENCOUNTER — Encounter (HOSPITAL_COMMUNITY)
Admission: EM | Disposition: A | Discharge status: Home Health | Payer: Self-pay | Source: Home / Self Care | Attending: Orthopedic Surgery

## 2016-10-17 ENCOUNTER — Inpatient Hospital Stay (HOSPITAL_COMMUNITY): Payer: Medicaid Other | Admitting: Anesthesiology

## 2016-10-17 ENCOUNTER — Encounter (HOSPITAL_COMMUNITY): Payer: Self-pay | Admitting: Certified Registered Nurse Anesthetist

## 2016-10-17 ENCOUNTER — Inpatient Hospital Stay (HOSPITAL_COMMUNITY): Payer: Medicaid Other

## 2016-10-17 HISTORY — PX: ORIF HUMERUS FRACTURE: SHX2126

## 2016-10-17 LAB — PROTIME-INR
INR: 1.08
PROTHROMBIN TIME: 14 s (ref 11.4–15.2)

## 2016-10-17 LAB — RENAL FUNCTION PANEL
Albumin: 2.9 g/dL — ABNORMAL LOW (ref 3.5–5.0)
Anion gap: 8 (ref 5–15)
BUN: 11 mg/dL (ref 6–20)
CALCIUM: 8.1 mg/dL — AB (ref 8.9–10.3)
CHLORIDE: 99 mmol/L — AB (ref 101–111)
CO2: 28 mmol/L (ref 22–32)
CREATININE: 1.17 mg/dL (ref 0.61–1.24)
Glucose, Bld: 204 mg/dL — ABNORMAL HIGH (ref 65–99)
Phosphorus: 3.2 mg/dL (ref 2.5–4.6)
Potassium: 4 mmol/L (ref 3.5–5.1)
Sodium: 135 mmol/L (ref 135–145)

## 2016-10-17 LAB — CBC
HCT: 28.7 % — ABNORMAL LOW (ref 39.0–52.0)
Hemoglobin: 9.8 g/dL — ABNORMAL LOW (ref 13.0–17.0)
MCH: 28.7 pg (ref 26.0–34.0)
MCHC: 34.1 g/dL (ref 30.0–36.0)
MCV: 84.2 fL (ref 78.0–100.0)
PLATELETS: 180 10*3/uL (ref 150–400)
RBC: 3.41 MIL/uL — AB (ref 4.22–5.81)
RDW: 14.3 % (ref 11.5–15.5)
WBC: 7.1 10*3/uL (ref 4.0–10.5)

## 2016-10-17 LAB — GLUCOSE, CAPILLARY
GLUCOSE-CAPILLARY: 122 mg/dL — AB (ref 65–99)
GLUCOSE-CAPILLARY: 153 mg/dL — AB (ref 65–99)
Glucose-Capillary: 190 mg/dL — ABNORMAL HIGH (ref 65–99)

## 2016-10-17 LAB — APTT: aPTT: 32 seconds (ref 24–36)

## 2016-10-17 SURGERY — OPEN REDUCTION INTERNAL FIXATION (ORIF) DISTAL HUMERUS FRACTURE
Anesthesia: General | Site: Arm Upper | Laterality: Left

## 2016-10-17 MED ORDER — MIDAZOLAM HCL 2 MG/2ML IJ SOLN
INTRAMUSCULAR | Status: AC
  Filled 2016-10-17: qty 2

## 2016-10-17 MED ORDER — ONDANSETRON HCL 4 MG/2ML IJ SOLN
INTRAMUSCULAR | Status: AC
  Filled 2016-10-17: qty 2

## 2016-10-17 MED ORDER — SUGAMMADEX SODIUM 200 MG/2ML IV SOLN
INTRAVENOUS | Status: DC | PRN
Start: 2016-10-17 — End: 2016-10-17
  Administered 2016-10-17: 200 mg via INTRAVENOUS

## 2016-10-17 MED ORDER — BUPIVACAINE-EPINEPHRINE (PF) 0.5% -1:200000 IJ SOLN
INTRAMUSCULAR | Status: DC | PRN
  Administered 2016-10-17: 25 mL via PERINEURAL

## 2016-10-17 MED ORDER — MORPHINE SULFATE (PF) 4 MG/ML IV SOLN
1.0000 mg | INTRAVENOUS | Status: DC | PRN
  Administered 2016-10-18 – 2016-10-19 (×4): 2 mg via INTRAVENOUS
  Filled 2016-10-17 (×4): qty 1

## 2016-10-17 MED ORDER — CEFAZOLIN SODIUM-DEXTROSE 1-4 GM/50ML-% IV SOLN
1.0000 g | Freq: Three times a day (TID) | INTRAVENOUS | Status: AC
  Administered 2016-10-17 – 2016-10-18 (×3): 1 g via INTRAVENOUS
  Filled 2016-10-17 (×3): qty 50

## 2016-10-17 MED ORDER — DEXAMETHASONE SODIUM PHOSPHATE 10 MG/ML IJ SOLN
INTRAMUSCULAR | Status: DC | PRN
  Administered 2016-10-17: 5 mg via INTRAVENOUS

## 2016-10-17 MED ORDER — PHENYLEPHRINE HCL 10 MG/ML IJ SOLN
INTRAVENOUS | Status: DC | PRN
  Administered 2016-10-17: 40 ug/min via INTRAVENOUS

## 2016-10-17 MED ORDER — PROPOFOL 10 MG/ML IV BOLUS
INTRAVENOUS | Status: AC
  Filled 2016-10-17: qty 20

## 2016-10-17 MED ORDER — PHENYLEPHRINE 40 MCG/ML (10ML) SYRINGE FOR IV PUSH (FOR BLOOD PRESSURE SUPPORT)
PREFILLED_SYRINGE | INTRAVENOUS | Status: DC | PRN
  Administered 2016-10-17: 200 ug via INTRAVENOUS
  Administered 2016-10-17: 80 ug via INTRAVENOUS
  Administered 2016-10-17: 120 ug via INTRAVENOUS

## 2016-10-17 MED ORDER — LEVALBUTEROL TARTRATE 45 MCG/ACT IN AERO: 1.0000 | INHALATION_SPRAY | Freq: Three times a day (TID) | RESPIRATORY_TRACT | Status: DC | PRN

## 2016-10-17 MED ORDER — PROPOFOL 10 MG/ML IV BOLUS
INTRAVENOUS | Status: DC | PRN
  Administered 2016-10-17: 200 mg via INTRAVENOUS

## 2016-10-17 MED ORDER — OXYCODONE HCL 5 MG PO TABS
5.0000 mg | ORAL_TABLET | ORAL | Status: DC | PRN
  Administered 2016-10-17 – 2016-10-23 (×19): 10 mg via ORAL
  Filled 2016-10-17 (×19): qty 2

## 2016-10-17 MED ORDER — FENTANYL CITRATE (PF) 100 MCG/2ML IJ SOLN
INTRAMUSCULAR | Status: DC | PRN
  Administered 2016-10-17: 50 ug via INTRAVENOUS
  Administered 2016-10-17: 75 ug via INTRAVENOUS
  Administered 2016-10-17: 25 ug via INTRAVENOUS

## 2016-10-17 MED ORDER — CEFAZOLIN SODIUM-DEXTROSE 1-4 GM/50ML-% IV SOLN
INTRAVENOUS | Status: AC
  Filled 2016-10-17: qty 50

## 2016-10-17 MED ORDER — FENTANYL CITRATE (PF) 250 MCG/5ML IJ SOLN
INTRAMUSCULAR | Status: AC
  Filled 2016-10-17: qty 5

## 2016-10-17 MED ORDER — SUCCINYLCHOLINE CHLORIDE 200 MG/10ML IV SOSY
PREFILLED_SYRINGE | INTRAVENOUS | Status: AC
  Filled 2016-10-17: qty 10

## 2016-10-17 MED ORDER — ONDANSETRON HCL 4 MG/2ML IJ SOLN
INTRAMUSCULAR | Status: DC | PRN
  Administered 2016-10-17: 4 mg via INTRAVENOUS

## 2016-10-17 MED ORDER — LACTATED RINGERS IV SOLN
INTRAVENOUS | Status: DC | PRN
  Administered 2016-10-17 (×4): via INTRAVENOUS

## 2016-10-17 MED ORDER — SUCCINYLCHOLINE CHLORIDE 200 MG/10ML IV SOSY
PREFILLED_SYRINGE | INTRAVENOUS | Status: DC | PRN
  Administered 2016-10-17: 120 mg via INTRAVENOUS

## 2016-10-17 MED ORDER — EPHEDRINE 5 MG/ML INJ
INTRAVENOUS | Status: AC
  Filled 2016-10-17: qty 10

## 2016-10-17 MED ORDER — 0.9 % SODIUM CHLORIDE (POUR BTL) OPTIME
TOPICAL | Status: DC | PRN
  Administered 2016-10-17: 1000 mL

## 2016-10-17 MED ORDER — ROCURONIUM BROMIDE 100 MG/10ML IV SOLN
INTRAVENOUS | Status: DC | PRN
  Administered 2016-10-17: 20 mg via INTRAVENOUS
  Administered 2016-10-17: 10 mg via INTRAVENOUS
  Administered 2016-10-17: 30 mg via INTRAVENOUS
  Administered 2016-10-17: 20 mg via INTRAVENOUS
  Administered 2016-10-17: 10 mg via INTRAVENOUS
  Administered 2016-10-17: 50 mg via INTRAVENOUS

## 2016-10-17 MED ORDER — LIDOCAINE HCL (CARDIAC) 20 MG/ML IV SOLN
INTRAVENOUS | Status: DC | PRN
  Administered 2016-10-17: 100 mg via INTRAVENOUS

## 2016-10-17 MED ORDER — MIDAZOLAM HCL 5 MG/5ML IJ SOLN
INTRAMUSCULAR | Status: DC | PRN
  Administered 2016-10-17: 2 mg via INTRAVENOUS

## 2016-10-17 MED ORDER — LEVALBUTEROL HCL 0.63 MG/3ML IN NEBU: 0.6300 mg | INHALATION_SOLUTION | Freq: Three times a day (TID) | RESPIRATORY_TRACT | Status: DC | PRN

## 2016-10-17 SURGICAL SUPPLY — 89 items
BIT DRILL 100X2XQC STRL (BIT) ×1 IMPLANT
BIT DRILL 2.8 (BIT) ×1
BIT DRILL CANN QC 2.8X165 (BIT) ×1 IMPLANT
BIT DRILL QC 2.0X100 (BIT) ×2
BIT DRL 100X2XQC STRL (BIT) ×1
BLADE AVERAGE 25MMX9MM (BLADE)
BLADE AVERAGE 25X9 (BLADE) IMPLANT
BNDG ESMARK 4X9 LF (GAUZE/BANDAGES/DRESSINGS) ×3 IMPLANT
BNDG GAUZE ELAST 4 BULKY (GAUZE/BANDAGES/DRESSINGS) ×6 IMPLANT
BRUSH SCRUB SURG 4.25 DISP (MISCELLANEOUS) ×6 IMPLANT
CLEANER TIP ELECTROSURG 2X2 (MISCELLANEOUS) ×3 IMPLANT
CORDS BIPOLAR (ELECTRODE) ×3 IMPLANT
COVER SURGICAL LIGHT HANDLE (MISCELLANEOUS) ×6 IMPLANT
DRAIN PENROSE 1/4X12 LTX STRL (WOUND CARE) ×3 IMPLANT
DRAPE C-ARM 42X72 X-RAY (DRAPES) ×3 IMPLANT
DRAPE C-ARMOR (DRAPES) IMPLANT
DRAPE HALF SHEET 40X57 (DRAPES) ×3 IMPLANT
DRAPE INCISE IOBAN 66X45 STRL (DRAPES) IMPLANT
DRAPE ORTHO SPLIT 77X108 STRL (DRAPES)
DRAPE SURG ORHT 6 SPLT 77X108 (DRAPES) IMPLANT
DRAPE U-SHAPE 47X51 STRL (DRAPES) ×6 IMPLANT
DRILL BIT 2.8MM (BIT) ×2
DRSG ADAPTIC 3X8 NADH LF (GAUZE/BANDAGES/DRESSINGS) ×3 IMPLANT
DRSG MEPILEX BORDER 4X12 (GAUZE/BANDAGES/DRESSINGS) ×3 IMPLANT
DRSG PAD ABDOMINAL 8X10 ST (GAUZE/BANDAGES/DRESSINGS) ×3 IMPLANT
ELECT REM PT RETURN 9FT ADLT (ELECTROSURGICAL) ×3
ELECTRODE REM PT RTRN 9FT ADLT (ELECTROSURGICAL) ×1 IMPLANT
EVACUATOR 1/8 PVC DRAIN (DRAIN) IMPLANT
GAUZE SPONGE 4X4 12PLY STRL (GAUZE/BANDAGES/DRESSINGS) ×6 IMPLANT
GLOVE BIO SURGEON STRL SZ7.5 (GLOVE) ×3 IMPLANT
GLOVE BIOGEL PI IND STRL 6.5 (GLOVE) ×3 IMPLANT
GLOVE BIOGEL PI IND STRL 7.5 (GLOVE) ×1 IMPLANT
GLOVE BIOGEL PI IND STRL 8 (GLOVE) ×1 IMPLANT
GLOVE BIOGEL PI INDICATOR 6.5 (GLOVE) ×6
GLOVE BIOGEL PI INDICATOR 7.5 (GLOVE) ×2
GLOVE BIOGEL PI INDICATOR 8 (GLOVE) ×2
GLOVE SURG SS PI 6.5 STRL IVOR (GLOVE) ×6 IMPLANT
GOWN STRL REUS W/ TWL LRG LVL3 (GOWN DISPOSABLE) ×3 IMPLANT
GOWN STRL REUS W/ TWL XL LVL3 (GOWN DISPOSABLE) ×1 IMPLANT
GOWN STRL REUS W/TWL LRG LVL3 (GOWN DISPOSABLE) ×6
GOWN STRL REUS W/TWL XL LVL3 (GOWN DISPOSABLE) ×2
HOVERMATT SINGLE USE (MISCELLANEOUS) ×3 IMPLANT
K-WIRE 1.6X150 (WIRE) ×3
KIT BASIN OR (CUSTOM PROCEDURE TRAY) ×3 IMPLANT
KIT ROOM TURNOVER OR (KITS) ×3 IMPLANT
KWIRE 1.6X150 (WIRE) ×1 IMPLANT
MANIFOLD NEPTUNE II (INSTRUMENTS) ×3 IMPLANT
NEEDLE HYPO 25X1 1.5 SAFETY (NEEDLE) ×3 IMPLANT
NS IRRIG 1000ML POUR BTL (IV SOLUTION) ×3 IMPLANT
PACK ORTHO EXTREMITY (CUSTOM PROCEDURE TRAY) ×3 IMPLANT
PAD ARMBOARD 7.5X6 YLW CONV (MISCELLANEOUS) ×6 IMPLANT
PLATE LC DCP 2.0 6H (Plate) ×3 IMPLANT
PLATE PROS LCP 20H 3.5MM 267MM (Plate) ×3 IMPLANT
SCREW CORTEX 2.4 24MM (Screw) ×6 IMPLANT
SCREW CORTEX 2.4 26MM (Screw) ×6 IMPLANT
SCREW CORTEX 2.4 32MM (Screw) ×3 IMPLANT
SCREW CORTEX 3.5 26MM (Screw) ×2 IMPLANT
SCREW CORTEX 3.5 32MM (Screw) ×2 IMPLANT
SCREW CORTEX 3.5 40MM (Screw) ×2 IMPLANT
SCREW LOCK CORT ST 3.5X26 (Screw) ×1 IMPLANT
SCREW LOCK CORT ST 3.5X32 (Screw) ×1 IMPLANT
SCREW LOCK CORT ST 3.5X40 (Screw) ×1 IMPLANT
SCREW LOCK T15 FT 24X3.5X2.9X (Screw) ×1 IMPLANT
SCREW LOCK T15 FT 30X3.5X2.9X (Screw) ×3 IMPLANT
SCREW LOCK T15 FT 32X3.5X2.9X (Screw) ×1 IMPLANT
SCREW LOCKING 3.5X24 (Screw) ×2 IMPLANT
SCREW LOCKING 3.5X26 (Screw) ×3 IMPLANT
SCREW LOCKING 3.5X30 (Screw) ×6 IMPLANT
SCREW LOCKING 3.5X32 (Screw) ×2 IMPLANT
SCRUB BETADINE 4OZ XXX (MISCELLANEOUS) ×3 IMPLANT
SLING ARM FOAM STRAP XLG (SOFTGOODS) ×3 IMPLANT
SOLUTION BETADINE 4OZ (MISCELLANEOUS) ×3 IMPLANT
SPONGE LAP 18X18 X RAY DECT (DISPOSABLE) IMPLANT
STAPLER VISISTAT 35W (STAPLE) ×3 IMPLANT
STOCKINETTE IMPERVIOUS 9X36 MD (GAUZE/BANDAGES/DRESSINGS) IMPLANT
SUCTION FRAZIER HANDLE 10FR (MISCELLANEOUS) ×2
SUCTION TUBE FRAZIER 10FR DISP (MISCELLANEOUS) ×1 IMPLANT
SUT ETHILON 3 0 PS 1 (SUTURE) ×6 IMPLANT
SUT VIC AB 0 CT1 27 (SUTURE) ×6
SUT VIC AB 0 CT1 27XBRD ANBCTR (SUTURE) ×3 IMPLANT
SUT VIC AB 2-0 CT1 27 (SUTURE) ×10
SUT VIC AB 2-0 CT1 TAPERPNT 27 (SUTURE) ×5 IMPLANT
SYR 5ML LL (SYRINGE) IMPLANT
SYR CONTROL 10ML LL (SYRINGE) ×3 IMPLANT
TOWEL OR 17X24 6PK STRL BLUE (TOWEL DISPOSABLE) ×3 IMPLANT
TOWEL OR 17X26 10 PK STRL BLUE (TOWEL DISPOSABLE) ×9 IMPLANT
TRAY FOLEY W/METER SILVER 16FR (SET/KITS/TRAYS/PACK) ×3 IMPLANT
WATER STERILE IRR 1000ML POUR (IV SOLUTION) ×3 IMPLANT
YANKAUER SUCT BULB TIP NO VENT (SUCTIONS) IMPLANT

## 2016-10-17 NOTE — Anesthesia Preprocedure Evaluation (Addendum)
Anesthesia Evaluation  Patient identified by MRN, date of birth, ID band Patient awake    Reviewed: Allergy & Precautions, NPO status , Patient's Chart, lab work & pertinent test results  Airway Mallampati: III  TM Distance: <3 FB Neck ROM: Limited    Dental  (+) Poor Dentition, Dental Advisory Given   Pulmonary Current Smoker,    + rhonchi  + decreased breath sounds      Cardiovascular hypertension,  Rhythm:Regular Rate:Normal     Neuro/Psych Anxiety    GI/Hepatic   Endo/Other  diabetes, Type 2Morbid obesity  Renal/GU      Musculoskeletal   Abdominal (+) + obese,   Peds  Hematology   Anesthesia Other Findings   Reproductive/Obstetrics                            Anesthesia Physical Anesthesia Plan  ASA: IV  Anesthesia Plan: General   Post-op Pain Management:    Induction: Intravenous  PONV Risk Score and Plan: 2 and Ondansetron and Dexamethasone  Airway Management Planned: Oral ETT and Video Laryngoscope Planned  Additional Equipment:   Intra-op Plan:   Post-operative Plan: Extubation in OR and Possible Post-op intubation/ventilation  Informed Consent: I have reviewed the patients History and Physical, chart, labs and discussed the procedure including the risks, benefits and alternatives for the proposed anesthesia with the patient or authorized representative who has indicated his/her understanding and acceptance.   Dental advisory given  Plan Discussed with: CRNA, Anesthesiologist and Surgeon  Anesthesia Plan Comments:        Anesthesia Quick Evaluation

## 2016-10-17 NOTE — Anesthesia Procedure Notes (Signed)
Anesthesia Regional Block: Supraclavicular block   Pre-Anesthetic Checklist: ,, timeout performed, Correct Patient, Correct Site, Correct Laterality, Correct Procedure, Correct Position, site marked, Risks and benefits discussed,  Surgical consent,  Pre-op evaluation,  At surgeon's request and post-op pain management  Laterality: Left and Lower  Prep: chloraprep       Needles:   Needle Type: Echogenic Stimulator Needle     Needle Length: 9cm  Needle Gauge: 21   Needle insertion depth: 8 cm   Additional Needles:   Procedures: ultrasound guided,,,,,,,,  Narrative:  Start time: 10/17/2016 8:30 AM End time: 10/17/2016 8:49 AM Injection made incrementally with aspirations every 5 mL.  Performed by: Personally  Anesthesiologist: Dartagnan Beavers

## 2016-10-17 NOTE — Brief Op Note (Signed)
10/14/2016 - 10/17/2016  12:35 PM  PATIENT:  [name]  41 y.o. male  PRE-OPERATIVE DIAGNOSIS:  LEFT SEGMENTAL HUMERUS FRACTURE, SHAFT AND SUPRACONDYLAR  POST-OPERATIVE DIAGNOSIS:  LEFT SEGMENTAL HUMERUS FRACTURE, SHAFT AND SUPRACONDYLAR  PROCEDURE:  Procedure(s): OPEN REDUCTION INTERNAL FIXATION (ORIF) LEFT DISTAL HUMERUS AND HUMERAL SHAFT FRACTURES (Left)  SURGEON:  Surgeon(s) and Role:    Altamese Brooklawn, MD - Primary  PHYSICIAN ASSISTANT: Ainsley Spinner, PA-C  ANESTHESIA:   regional and general  EBL:  Total I/O In: 2000 [I.V.:2000] Out: 550 [Urine:500; Blood:50]  BLOOD ADMINISTERED:none  DRAINS: none   LOCAL MEDICATIONS USED:  NONE  SPECIMEN:  No Specimen  DISPOSITION OF SPECIMEN:  N/A  COUNTS:  YES  TOURNIQUET:    DICTATION: .Other Dictation: Dictation Number [id number]...  PLAN OF CARE: Admit to inpatient   PATIENT DISPOSITION:  PACU - hemodynamically stable.   Delay start of Pharmacological VTE agent (>24hrs) due to surgical blood loss or risk of bleeding: no

## 2016-10-17 NOTE — Anesthesia Procedure Notes (Signed)
Procedure Name: Intubation Date/Time: 10/17/2016 8:34 AM Performed by: Carney Living Pre-anesthesia Checklist: Patient identified, Emergency Drugs available, Suction available, Patient being monitored and Timeout performed Patient Re-evaluated:Patient Re-evaluated prior to induction Oxygen Delivery Method: Circle system utilized Preoxygenation: Pre-oxygenation with 100% oxygen Induction Type: IV induction Laryngoscope Size: Glidescope and 4 Grade View: Grade I Tube type: Oral Tube size: 7.5 mm Number of attempts: 1 Airway Equipment and Method: Stylet and Video-laryngoscopy Placement Confirmation: ETT inserted through vocal cords under direct vision,  positive ETCO2 and breath sounds checked- equal and bilateral Secured at: 23 cm Tube secured with: Tape Dental Injury: Teeth and Oropharynx as per pre-operative assessment

## 2016-10-17 NOTE — Transfer of Care (Signed)
Immediate Anesthesia Transfer of Care Note  Patient: Kevin Garrett  Procedure(s) Performed: Procedure(s): OPEN REDUCTION INTERNAL FIXATION (ORIF) LEFT DISTAL HUMERUS FRACTURE (Left)  Patient Location: PACU  Anesthesia Type:General  Level of Consciousness: awake, alert , oriented and patient cooperative  Airway & Oxygen Therapy: Patient Spontanous Breathing and Patient connected to nasal cannula oxygen  Post-op Assessment: Report given to RN and Post -op Vital signs reviewed and stable  Post vital signs: Reviewed and stable  Last Vitals:  Vitals:   10/17/16 0608 10/17/16 1307  BP: (!) 143/90   Pulse: 89   Resp: 18   Temp: 36.8 C (P) 36.7 C    Last Pain:  Vitals:   10/17/16 0608  TempSrc: Oral  PainSc:       Patients Stated Pain Goal: 2 (97/67/34 1937)  Complications: No apparent anesthesia complications

## 2016-10-18 ENCOUNTER — Encounter (HOSPITAL_COMMUNITY): Payer: Self-pay | Admitting: Orthopedic Surgery

## 2016-10-18 DIAGNOSIS — E119 Type 2 diabetes mellitus without complications: Secondary | ICD-10-CM

## 2016-10-18 DIAGNOSIS — G4730 Sleep apnea, unspecified: Secondary | ICD-10-CM | POA: Diagnosis present

## 2016-10-18 DIAGNOSIS — E559 Vitamin D deficiency, unspecified: Secondary | ICD-10-CM

## 2016-10-18 DIAGNOSIS — F17200 Nicotine dependence, unspecified, uncomplicated: Secondary | ICD-10-CM

## 2016-10-18 DIAGNOSIS — I10 Essential (primary) hypertension: Secondary | ICD-10-CM | POA: Diagnosis present

## 2016-10-18 HISTORY — DX: Vitamin D deficiency, unspecified: E55.9

## 2016-10-18 HISTORY — DX: Nicotine dependence, unspecified, uncomplicated: F17.200

## 2016-10-18 LAB — CBC
HCT: 27 % — ABNORMAL LOW (ref 39.0–52.0)
Hemoglobin: 9.2 g/dL — ABNORMAL LOW (ref 13.0–17.0)
MCH: 29.2 pg (ref 26.0–34.0)
MCHC: 34.1 g/dL (ref 30.0–36.0)
MCV: 85.7 fL (ref 78.0–100.0)
PLATELETS: 188 10*3/uL (ref 150–400)
RBC: 3.15 MIL/uL — ABNORMAL LOW (ref 4.22–5.81)
RDW: 15 % (ref 11.5–15.5)
WBC: 7 10*3/uL (ref 4.0–10.5)

## 2016-10-18 LAB — COMPREHENSIVE METABOLIC PANEL
ALBUMIN: 2.8 g/dL — AB (ref 3.5–5.0)
ALT: 29 U/L (ref 17–63)
AST: 61 U/L — AB (ref 15–41)
Alkaline Phosphatase: 49 U/L (ref 38–126)
Anion gap: 8 (ref 5–15)
BUN: 12 mg/dL (ref 6–20)
CALCIUM: 7.9 mg/dL — AB (ref 8.9–10.3)
CO2: 29 mmol/L (ref 22–32)
CREATININE: 1.05 mg/dL (ref 0.61–1.24)
Chloride: 101 mmol/L (ref 101–111)
GFR calc non Af Amer: >60 mL/min (ref >60)
GLUCOSE: 122 mg/dL — AB (ref 65–99)
Potassium: 4.1 mmol/L (ref 3.5–5.1)
Sodium: 138 mmol/L (ref 135–145)
TOTAL PROTEIN: 6.1 g/dL — AB (ref 6.5–8.1)
Total Bilirubin: 0.8 mg/dL (ref 0.3–1.2)

## 2016-10-18 LAB — VITAMIN D 25 HYDROXY (VIT D DEFICIENCY, FRACTURES): Vit D, 25-Hydroxy: 14.9 ng/mL — ABNORMAL LOW (ref 30.0–100.0)

## 2016-10-18 LAB — GLUCOSE, CAPILLARY
GLUCOSE-CAPILLARY: 120 mg/dL — AB (ref 65–99)
GLUCOSE-CAPILLARY: 155 mg/dL — AB (ref 65–99)
GLUCOSE-CAPILLARY: 114 mg/dL — AB (ref 65–99)
Glucose-Capillary: 163 mg/dL — ABNORMAL HIGH (ref 65–99)

## 2016-10-18 MED ORDER — OXYCODONE-ACETAMINOPHEN 5-325 MG PO TABS
1.0000 | ORAL_TABLET | Freq: Four times a day (QID) | ORAL | Status: DC | PRN
  Administered 2016-10-18 – 2016-10-19 (×3): 2 via ORAL
  Filled 2016-10-18 (×3): qty 2

## 2016-10-18 NOTE — Anesthesia Postprocedure Evaluation (Signed)
Anesthesia Post Note  Patient: Kevin Garrett  Procedure(s) Performed: Procedure(s) (LRB): OPEN REDUCTION INTERNAL FIXATION (ORIF) LEFT DISTAL HUMERUS FRACTURE (Left)     Patient location during evaluation: PACU Anesthesia Type: General and Regional Level of consciousness: awake and alert, awake and sedated Pain management: pain level controlled Vital Signs Assessment: post-procedure vital signs reviewed and stable Respiratory status: spontaneous breathing, nonlabored ventilation, respiratory function stable and patient connected to nasal cannula oxygen Cardiovascular status: blood pressure returned to baseline and stable Postop Assessment: no signs of nausea or vomiting Anesthetic complications: no    Last Vitals:  Vitals:   10/17/16 2300 10/18/16 0421  BP: (!) 147/82 (!) 146/82  Pulse: 93 90  Resp: 18 18  Temp: 36.8 C 36.8 C    Last Pain:  Vitals:   10/18/16 0842  TempSrc:   PainSc: 10-Worst pain ever                 Jazzy Parmer,JAMES TERRILL

## 2016-10-18 NOTE — Progress Notes (Signed)
Placed patient on CPAP for the night. Patient tolerating well.
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
## 2016-10-19 ENCOUNTER — Encounter (HOSPITAL_COMMUNITY): Payer: Self-pay | Admitting: Orthopedic Surgery

## 2016-10-19 LAB — GLUCOSE, CAPILLARY
GLUCOSE-CAPILLARY: 127 mg/dL — AB (ref 65–99)
GLUCOSE-CAPILLARY: 117 mg/dL — AB (ref 65–99)
Glucose-Capillary: 111 mg/dL — ABNORMAL HIGH (ref 65–99)
Glucose-Capillary: 183 mg/dL — ABNORMAL HIGH (ref 65–99)

## 2016-10-19 LAB — CBC
HEMATOCRIT: 26.1 % — AB (ref 39.0–52.0)
HEMOGLOBIN: 8.8 g/dL — AB (ref 13.0–17.0)
MCH: 29.2 pg (ref 26.0–34.0)
MCHC: 33.7 g/dL (ref 30.0–36.0)
MCV: 86.7 fL (ref 78.0–100.0)
Platelets: 201 10*3/uL (ref 150–400)
RBC: 3.01 MIL/uL — AB (ref 4.22–5.81)
RDW: 14.9 % (ref 11.5–15.5)
WBC: 6.1 10*3/uL (ref 4.0–10.5)

## 2016-10-19 LAB — RENAL FUNCTION PANEL
ANION GAP: 7 (ref 5–15)
Albumin: 2.6 g/dL — ABNORMAL LOW (ref 3.5–5.0)
BUN: 8 mg/dL (ref 6–20)
CALCIUM: 8 mg/dL — AB (ref 8.9–10.3)
CO2: 31 mmol/L (ref 22–32)
Chloride: 100 mmol/L — ABNORMAL LOW (ref 101–111)
Creatinine, Ser: 0.94 mg/dL (ref 0.61–1.24)
GFR calc non Af Amer: >60 mL/min (ref >60)
Glucose, Bld: 116 mg/dL — ABNORMAL HIGH (ref 65–99)
Phosphorus: 2.4 mg/dL — ABNORMAL LOW (ref 2.5–4.6)
Potassium: 4 mmol/L (ref 3.5–5.1)
SODIUM: 138 mmol/L (ref 135–145)

## 2016-10-19 MED ORDER — OXYCODONE-ACETAMINOPHEN 5-325 MG PO TABS
1.0000 | ORAL_TABLET | Freq: Four times a day (QID) | ORAL | Status: DC | PRN
  Administered 2016-10-19 – 2016-10-23 (×13): 2 via ORAL
  Filled 2016-10-19 (×14): qty 2

## 2016-10-19 MED ORDER — MORPHINE SULFATE (PF) 4 MG/ML IV SOLN
1.0000 mg | Freq: Three times a day (TID) | INTRAVENOUS | Status: DC | PRN
  Administered 2016-10-20 – 2016-10-23 (×5): 2 mg via INTRAVENOUS
  Filled 2016-10-19 (×5): qty 1

## 2016-10-19 MED ORDER — BACITRACIN-NEOMYCIN-POLYMYXIN OINTMENT TUBE
TOPICAL_OINTMENT | Freq: Two times a day (BID) | CUTANEOUS | Status: DC
  Administered 2016-10-19: 1 via TOPICAL
  Administered 2016-10-20 – 2016-10-23 (×7): via TOPICAL
  Filled 2016-10-19: qty 14.17
  Filled 2016-10-19 (×2): qty 1

## 2016-10-19 MED ORDER — VITAMIN D 1000 UNITS PO TABS
2000.0000 [IU] | ORAL_TABLET | Freq: Two times a day (BID) | ORAL | Status: DC
  Administered 2016-10-19 – 2016-10-23 (×8): 2000 [IU] via ORAL
  Filled 2016-10-19 (×8): qty 2

## 2016-10-19 NOTE — Evaluation (Signed)
Occupational Therapy Evaluation Patient Details Name: Kevin Garrett MRN: [id number] DOB: [birth year] Today's Date: 10/19/2016    History of Present Illness Pt is a 41 y.o. male s/p L scapula and L distal humerus fractures. Now s/p ORIF L humerus fx. PMHx: Anxiety, DM, HTN.   Clinical Impression   Pt reports he was independent with ADL PTA. Currently pt requires min HHA for functional mobility and max assist overall for ADL. Pt tolerated hand and wrist exercises well, minimally tolerated elbow exercises-limited due to pain and edema. Recommending SNF for follow up to maximize independence and safety with ADL and functional mobility prior to return home. Pt would benefit from continued skilled OT to address established goals.    Follow Up Recommendations  SNF    Equipment Recommendations  Other (comment) (TBD at next venue)    Recommendations for Other Services PT consult     Precautions / Restrictions Precautions Precautions: Shoulder Type of Shoulder Precautions: A/PROM elbow/wrist/hand OK. A/PROM shoulder flexion/extension OK. NO active shoulder ABduction, passive OK. Pendulums with or without sling OK. Shoulder Interventions: Shoulder sling/immobilizer Required Braces or Orthoses: Sling Restrictions Weight Bearing Restrictions: Yes LUE Weight Bearing: Non weight bearing      Mobility Bed Mobility Overal bed mobility: Needs Assistance Bed Mobility: Supine to Sit     Supine to sit: Min assist;HOB elevated     General bed mobility comments: Min assist for trunk elevation to sitting. HOB elevated with use of bed rail  Transfers Overall transfer level: Needs assistance Equipment used: 1 person hand held assist Transfers: Sit to/from Stand Sit to Stand: Min assist         General transfer comment: Min HHA for steadying    Balance Overall balance assessment: Needs assistance Sitting-balance support: Feet supported;No upper extremity supported Sitting balance-Leahy  Scale: Good     Standing balance support: Single extremity supported Standing balance-Leahy Scale: Fair                             ADL either performed or assessed with clinical judgement   ADL Overall ADL's : Needs assistance/impaired Eating/Feeding: Set up;Sitting   Grooming: Minimal assistance;Sitting   Upper Body Bathing: Maximal assistance;Sitting   Lower Body Bathing: Maximal assistance;Sit to/from stand   Upper Body Dressing : Maximal assistance;Sitting Upper Body Dressing Details (indicate cue type and reason): for sling mangement Lower Body Dressing: Maximal assistance;Sit to/from stand   Toilet Transfer: Minimal assistance;Ambulation Toilet Transfer Details (indicate cue type and reason): Simulated by sit to stand from EOB with functional mobility in room         Functional mobility during ADLs: Minimal assistance (HHA) General ADL Comments: Educated on sling management and proper positioning of sling and LUE in bed and sitting. Educated on edema control and digit ROM throughout the day.     Vision         Perception     Praxis      Pertinent Vitals/Pain Pain Assessment: Faces Faces Pain Scale: Hurts whole lot Pain Location: L shoulder Pain Descriptors / Indicators: Aching;Grimacing;Guarding Pain Intervention(s): Monitored during session;Limited activity within patient's tolerance;Repositioned;RN gave pain meds during session;Ice applied     Hand Dominance Right   Extremity/Trunk Assessment Upper Extremity Assessment Upper Extremity Assessment: LUE deficits/detail LUE Deficits / Details: Active movement at fingers and wrist, minimally at elbow. Increased edema noted thorughout LUE LUE: Unable to fully assess due to pain;Unable to fully assess due to immobilization  Lower Extremity Assessment Lower Extremity Assessment: Defer to PT evaluation       Communication Communication Communication: No difficulties   Cognition  Arousal/Alertness: Awake/alert Behavior During Therapy: WFL for tasks assessed/performed Overall Cognitive Status: Within Functional Limits for tasks assessed                                     General Comments       Exercises Exercises: General Upper Extremity General Exercises - Upper Extremity Elbow Flexion: Self ROM;Left;10 reps;Seated (limited ROM ~10 degrees total) Wrist Flexion: AROM;Left;10 reps;Seated Digit Composite Flexion: AROM;Left;10 reps;Seated   Shoulder Instructions      Home Living Family/patient expects to be discharged to:: Private residence Living Arrangements: Children (34 y.o. daughter) Available Help at Discharge: Family;Available PRN/intermittently Type of Home: Apartment Home Access: Level entry     Home Layout: One level     Bathroom Shower/Tub: Teacher, early years/pre: Standard     Home Equipment: None          Prior Functioning/Environment Level of Independence: Independent        Comments: works at [employer]        OT Problem List: Decreased range of motion;Impaired balance (sitting and/or standing);Decreased knowledge of use of DME or AE;Decreased knowledge of precautions;Obesity;Impaired UE functional use;Pain;Increased edema      OT Treatment/Interventions: Self-care/ADL training;Therapeutic exercise;Energy conservation;DME and/or AE instruction;Therapeutic activities;Patient/family education;Balance training    OT Goals(Current goals can be found in the care plan section) Acute Rehab OT Goals Patient Stated Goal: go to rehab to get better OT Goal Formulation: With patient Time For Goal Achievement: 11/02/16 Potential to Achieve Goals: Good ADL Goals Pt Will Perform Upper Body Bathing: with supervision;sitting Pt Will Perform Lower Body Bathing: with supervision;sit to/from stand;with adaptive equipment Pt Will Transfer to Toilet: with supervision;ambulating;regular height toilet Pt Will  Perform Toileting - Clothing Manipulation and hygiene: with supervision;sit to/from stand Additional ADL Goal #1: Pt will don/doff sling with set up as precursor to ADL. Additional ADL Goal #2: Pt will independently perform LUE pendulums and elbow/wrist/hand AROM exercises.  OT Frequency: Min 3X/week   Barriers to D/C: Decreased caregiver support          Co-evaluation              AM-PAC PT "6 Clicks" Daily Activity     Outcome Measure Help from another person eating meals?: A Little Help from another person taking care of personal grooming?: A Lot Help from another person toileting, which includes using toliet, bedpan, or urinal?: A Lot Help from another person bathing (including washing, rinsing, drying)?: A Lot Help from another person to put on and taking off regular upper body clothing?: A Lot Help from another person to put on and taking off regular lower body clothing?: A Lot 6 Click Score: 13   End of Session Equipment Utilized During Treatment: Other (comment) (sling) Nurse Communication: Mobility status  Activity Tolerance: Patient tolerated treatment well Patient left: in chair;with call bell/phone within reach  OT Visit Diagnosis: Unsteadiness on feet (R26.81);Pain Pain - Right/Left: Left Pain - part of body: Shoulder                Time: 9150-5697 OT Time Calculation (min): 23 min Charges:  OT General Charges $OT Visit: 1 Procedure OT Evaluation $OT Eval Moderate Complexity: 1 Procedure OT Treatments $Self Care/Home Management : 8-22 mins  G-Codes:     Mervin Hack. Ulice Brilliant, M.S., OTR/L Pager: [place] 10/19/2016, 1:29 PM
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
## 2016-10-19 NOTE — Evaluation (Signed)
Physical Therapy Evaluation Patient Details Name: Kevin Garrett MRN: [id number] DOB: 1975/11/17 Today's Date: 10/19/2016   History of Present Illness  Pt is a 41 y.o. male s/p L scapula and L distal humerus fractures. Now s/p ORIF L humerus fx. PMHx: Anxiety, DM, HTN.  Clinical Impression  Pt admitted with above diagnosis. Pt currently with functional limitations due to the deficits listed below (see PT Problem List). Pt ambulated 400' holding IV pole. He requires min A for bed mobility and transfers. He hopes to DC to [facility], but may be able to DC home with HHPT, depending on progress.  Pt will benefit from skilled PT to increase their independence and safety with mobility to allow discharge to the venue listed below.       Follow Up Recommendations Supervision for mobility/OOB;SNF (SNF vs HHPT depending on progress)    Equipment Recommendations  Cane    Recommendations for Other Services       Precautions / Restrictions Precautions Precautions: Shoulder Type of Shoulder Precautions: A/PROM elbow/wrist/hand OK. A/PROM shoulder flexion/extension OK. NO active shoulder ABduction, passive OK. Pendulums with or without sling OK. Shoulder Interventions: Shoulder sling/immobilizer Required Braces or Orthoses: Sling Restrictions Weight Bearing Restrictions: Yes LUE Weight Bearing: Non weight bearing      Mobility  Bed Mobility Overal bed mobility: Needs Assistance Bed Mobility: Sit to Supine     Supine to sit: Min assist;HOB elevated Sit to supine: Min assist   General bed mobility comments: min A for LEs into bed  Transfers Overall transfer level: Needs assistance Equipment used: 1 person hand held assist Transfers: Sit to/from Stand Sit to Stand: Min assist         General transfer comment: min A to rise from recliner, VCs for RUE placement on armrest, labored  Ambulation/Gait Ambulation/Gait assistance: Supervision Ambulation Distance (Feet): 400 Feet Assistive  device: 1 person hand held assist (pt held IV pole with RUE) Gait Pattern/deviations: Step-through pattern;Decreased step length - right;Decreased step length - left   Gait velocity interpretation: Below normal speed for age/gender General Gait Details: steady with IV pole, LUE in sling  Stairs            Wheelchair Mobility    Modified Rankin (Stroke Patients Only)       Balance Overall balance assessment: Needs assistance Sitting-balance support: Feet supported;No upper extremity supported Sitting balance-Leahy Scale: Good     Standing balance support: Single extremity supported Standing balance-Leahy Scale: Fair                               Pertinent Vitals/Pain Pain Assessment: 0-10 Pain Score: 7  Faces Pain Scale: Hurts whole lot Pain Location: L shoulder Pain Descriptors / Indicators: Aching;Grimacing;Guarding Pain Intervention(s): Ice applied;Repositioned;Limited activity within patient's tolerance;Monitored during session;Premedicated before session    Home Living Family/patient expects to be discharged to:: Private residence Living Arrangements: Children (41 y.o. daughter) Available Help at Discharge: Family;Available PRN/intermittently Type of Home: Apartment Home Access: Level entry     Home Layout: One level Home Equipment: None      Prior Function Level of Independence: Independent         Comments: works at a [employer] Hand: Right    Extremity/Trunk Assessment   Upper Extremity Assessment Upper Extremity Assessment: Defer to OT evaluation LUE Deficits / Details: Active movement at fingers and wrist, minimally at elbow. Increased edema  noted thorughout LUE LUE: Unable to fully assess due to pain;Unable to fully assess due to immobilization    Lower Extremity Assessment Lower Extremity Assessment: Overall WFL for tasks assessed    Cervical / Trunk Assessment Cervical / Trunk  Assessment: Normal  Communication   Communication: No difficulties  Cognition Arousal/Alertness: Awake/alert Behavior During Therapy: WFL for tasks assessed/performed Overall Cognitive Status: Within Functional Limits for tasks assessed                                        General Comments      Exercises General Exercises - Upper Extremity Elbow Flexion: Self ROM;Left;10 reps;Seated (limited ROM ~10 degrees total) Wrist Flexion: AROM;Left;10 reps;Seated Digit Composite Flexion: AROM;Left;10 reps;Seated   Assessment/Plan    PT Assessment Patient needs continued PT services  PT Problem List Decreased activity tolerance;Decreased balance;Pain;Decreased mobility;Obesity       PT Treatment Interventions DME instruction;Gait training;Functional mobility training;Therapeutic exercise;Patient/family education;Therapeutic activities    PT Goals (Current goals can be found in the Care Plan section)  Acute Rehab PT Goals Patient Stated Goal: go to rehab to get better, likes to fish PT Goal Formulation: With patient Time For Goal Achievement: 11/02/16 Potential to Achieve Goals: Good    Frequency Min 3X/week   Barriers to discharge Decreased caregiver support      Co-evaluation               AM-PAC PT "6 Clicks" Daily Activity  Outcome Measure Difficulty turning over in bed (including adjusting bedclothes, sheets and blankets)?: A Lot Difficulty moving from lying on back to sitting on the side of the bed? : Total Difficulty sitting down on and standing up from a chair with arms (e.g., wheelchair, bedside commode, etc,.)?: Total Help needed moving to and from a bed to chair (including a wheelchair)?: A Little Help needed walking in hospital room?: A Little Help needed climbing 3-5 steps with a railing? : A Little 6 Click Score: 13    End of Session Equipment Utilized During Treatment: Gait belt Activity Tolerance: Patient tolerated treatment  well Patient left: in bed;with call bell/phone within reach Nurse Communication: Mobility status;Patient requests pain meds PT Visit Diagnosis: Difficulty in walking, not elsewhere classified (R26.2);Pain Pain - Right/Left: Left Pain - part of body: Shoulder    Time: 3151-7616 PT Time Calculation (min) (ACUTE ONLY): 31 min   Charges:   PT Evaluation $PT Eval Low Complexity: 1 Low PT Treatments $Gait Training: 8-22 mins   PT G Codes:          Philomena Doheny 10/19/2016, 1:48 PM [phone]

## 2016-10-20 LAB — CBC
HEMATOCRIT: 26.8 % — AB (ref 39.0–52.0)
HEMOGLOBIN: 9.1 g/dL — AB (ref 13.0–17.0)
MCH: 29.1 pg (ref 26.0–34.0)
MCHC: 34 g/dL (ref 30.0–36.0)
MCV: 85.6 fL (ref 78.0–100.0)
PLATELETS: 236 10*3/uL (ref 150–400)
RBC: 3.13 MIL/uL — ABNORMAL LOW (ref 4.22–5.81)
RDW: 14.4 % (ref 11.5–15.5)
WBC: 6.2 10*3/uL (ref 4.0–10.5)

## 2016-10-20 LAB — GLUCOSE, CAPILLARY
GLUCOSE-CAPILLARY: 162 mg/dL — AB (ref 65–99)
Glucose-Capillary: 127 mg/dL — ABNORMAL HIGH (ref 65–99)
Glucose-Capillary: 116 mg/dL — ABNORMAL HIGH (ref 65–99)
Glucose-Capillary: 123 mg/dL — ABNORMAL HIGH (ref 65–99)

## 2016-10-20 MED ORDER — METHOCARBAMOL 500 MG PO TABS: 500.0000 mg | ORAL_TABLET | Freq: Four times a day (QID) | ORAL | 0 refills | Status: DC | PRN

## 2016-10-20 MED ORDER — CITALOPRAM HYDROBROMIDE 10 MG PO TABS
30.0000 mg | ORAL_TABLET | Freq: Every day | ORAL | 1 refills | Status: AC
Start: 2016-10-21 — End: ?

## 2016-10-20 MED ORDER — OXYCODONE HCL 5 MG PO TABS: 5.0000 mg | ORAL_TABLET | Freq: Four times a day (QID) | ORAL | 0 refills | Status: DC | PRN

## 2016-10-20 MED ORDER — VITAMIN D-3 125 MCG (5000 UT) PO TABS: 2000.0000 [IU] | ORAL_TABLET | Freq: Every day | ORAL | 2 refills | Status: DC

## 2016-10-20 MED ORDER — OXYCODONE-ACETAMINOPHEN 7.5-325 MG PO TABS: 1.0000 | ORAL_TABLET | Freq: Four times a day (QID) | ORAL | 0 refills | Status: AC | PRN

## 2016-10-20 MED ORDER — DOCUSATE SODIUM 100 MG PO CAPS: 100.0000 mg | ORAL_CAPSULE | Freq: Two times a day (BID) | ORAL | 0 refills | Status: DC
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
## 2016-10-20 NOTE — Discharge Summary (Signed)
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               No pharmacologics at dc as pt with upper extremity injury and able to mobilize/ambulate      - Metabolic Bone Disease:             vitamin d deficiency                          Supplement    - Activity:             NWB Left Upper Extremity    - FEN/GI prophylaxis/Foley/Lines:              carb modified             dc IV and IVF    - Dispo:             dc home today             Follow up in 10 days with ortho   Signed:  Jari Pigg, PA-C [facility] [phone] (P) 10/23/2016, 10:12 AM
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
## 2016-10-20 NOTE — Progress Notes (Signed)
Occupational Therapy Treatment Patient Details Name: Kevin Garrett MRN: [id number] DOB: 1975-08-06 Today's Date: 10/20/2016    History of present illness Pt is a 41 y.o. male s/p L scapula and L distal humerus fractures. Now s/p ORIF L humerus fx. PMHx: Anxiety, DM, HTN.   OT comments  Pt making progress toward OT goals; requires encouragement for mobility but able to perform toilet transfer with min guard assist. Pt tolerated LUE elbow and hand AROM x10 each and able to dangle LUE standing at counter top. Updated d/c plan to [facility] for follow up to maximize independence and safety with ADL and functional mobility. Pending progress may be able to d/c home without OT follow up. Will continue to follow acutely.   Follow Up Recommendations  Home health OT    Equipment Recommendations  3 in 1 bedside commode    Recommendations for Other Services      Precautions / Restrictions Precautions Precautions: Shoulder Type of Shoulder Precautions: A/PROM elbow/wrist/hand OK. A/PROM shoulder flexion/extension OK. NO active shoulder ABduction, passive OK. Pendulums with or without sling OK. Shoulder Interventions: Shoulder sling/immobilizer Required Braces or Orthoses: Sling Restrictions Weight Bearing Restrictions: Yes LUE Weight Bearing: Non weight bearing       Mobility Bed Mobility Overal bed mobility: Needs Assistance Bed Mobility: Supine to Sit;Sit to Supine     Supine to sit: Min assist;HOB elevated Sit to supine: Mod assist;HOB elevated   General bed mobility comments: Assist for trunk elevation. Asssit for controlled trunk descent to bed and LEs back to bed  Transfers Overall transfer level: Needs assistance Equipment used: None Transfers: Sit to/from Stand Sit to Stand: Min guard         General transfer comment: for safety    Balance Overall balance assessment: Needs assistance Sitting-balance support: Feet supported;No upper extremity supported Sitting  balance-Leahy Scale: Good     Standing balance support: No upper extremity supported;During functional activity Standing balance-Leahy Scale: Fair                             ADL either performed or assessed with clinical judgement   ADL Overall ADL's : Needs assistance/impaired     Grooming: Min guard;Standing;Wash/dry hands           Upper Body Dressing : Maximal assistance;Standing Upper Body Dressing Details (indicate cue type and reason): to don/doff sling     Toilet Transfer: Min guard;Ambulation Toilet Transfer Details (indicate cue type and reason): Pt able to stand up at toilet to void         Functional mobility during ADLs: Min guard       Vision       Perception     Praxis      Cognition Arousal/Alertness: Awake/alert Behavior During Therapy: WFL for tasks assessed/performed Overall Cognitive Status: Within Functional Limits for tasks assessed                                          Exercises Exercises: Other exercises Other Exercises Other Exercises: Pt able to perform active LUE elbow flex/ext, digit flex/ext x10 Other Exercises: Pt able to stand at counter top and dangle LUE; did not attempt pendulums due to pain    Shoulder Instructions       General Comments Pt reported he has been blacking out this AM. Noted L  eye was bloodshot. RN notified.    Pertinent Vitals/ Pain       Pain Assessment: Faces Faces Pain Scale: Hurts whole lot Pain Location: L shoulder Pain Descriptors / Indicators: Aching;Grimacing;Guarding Pain Intervention(s): Monitored during session;Limited activity within patient's tolerance;Ice applied  Home Living                                          Prior Functioning/Environment              Frequency  Min 3X/week        Progress Toward Goals  OT Goals(current goals can now be found in the care plan section)  Progress towards OT goals: Progressing toward  goals  Acute Rehab OT Goals Patient Stated Goal: go to rehab to get better, likes to fish OT Goal Formulation: With patient  Plan Discharge plan needs to be updated    Co-evaluation                 AM-PAC PT "6 Clicks" Daily Activity     Outcome Measure   Help from another person eating meals?: A Little Help from another person taking care of personal grooming?: A Little Help from another person toileting, which includes using toliet, bedpan, or urinal?: A Little Help from another person bathing (including washing, rinsing, drying)?: A Lot Help from another person to put on and taking off regular upper body clothing?: A Lot Help from another person to put on and taking off regular lower body clothing?: A Lot 6 Click Score: 15    End of Session Equipment Utilized During Treatment: Other (comment) (sling)  OT Visit Diagnosis: Unsteadiness on feet (R26.81);Pain Pain - Right/Left: Left Pain - part of body: Shoulder   Activity Tolerance Patient tolerated treatment well   Patient Left in bed;with call bell/phone within reach;with nursing/sitter in room   Nurse Communication Other (comment) (IV coming out)        Time: 4627-0350 OT Time Calculation (min): 26 min  Charges: OT General Charges $OT Visit: 1 Procedure OT Treatments $Self Care/Home Management : 8-22 mins $Therapeutic Exercise: 8-22 mins  Shenika Quint A. Ulice Brilliant, M.S., OTR/L Pager: [place] 10/20/2016, 3:54 PM

## 2016-10-20 NOTE — Progress Notes (Signed)
Orthopedic Tech Progress Note Patient Details:  Kevin Garrett May 16, 1975 [id number]  Patient ID: [name], male   DOB: 02/23/1976, 41 y.o.   MRN: [id number]  Overhead frame applied.  Kevin Garrett 10/20/2016, 1:23 PM

## 2016-10-20 NOTE — Discharge Instructions (Signed)
Orthopaedic Trauma Service Discharge Instructions   General Discharge Instructions  WEIGHT BEARING STATUS: nonweightbearing left upper extremity   RANGE OF MOTION/ACTIVITY: no active L shoulder abduction. Ok to move shoulder in flexion and extension. Shoulder pendulums in or out of sling Active and passive motion of elbow, forearm, wrist and hand   Wound Care: daily wound care starting once you get home. Ok to shower and clean wound with soap and water.   Discharge Wound Care Instructions  Do NOT apply any ointments, solutions or lotions to pin sites or surgical wounds.  These prevent needed drainage and even though solutions like hydrogen peroxide kill bacteria, they also damage cells lining the pin sites that help fight infection.  Applying lotions or ointments can keep the wounds moist and can cause them to breakdown and open up as well. This can increase the risk for infection. When in doubt call the office.  Surgical incisions should be dressed daily.  If any drainage is noted, use one layer of adaptic, then gauze, Kerlix, and an ace wrap.  Once the incision is completely dry and without drainage, it may be left open to air out.  Showering may begin 36-48 hours later.  Cleaning gently with soap and water.  Traumatic wounds should be dressed daily as well.    One layer of adaptic, gauze, Kerlix, then ace wrap.  The adaptic can be discontinued once the draining has ceased    If you have a wet to dry dressing: wet the gauze with saline the squeeze as much saline out so the gauze is moist (not soaking wet), place moistened gauze over wound, then place a dry gauze over the moist one, followed by Kerlix wrap, then ace wrap.   DVT/PE prophylaxis: no pharmacologic prophylaxis. Walk as much as possible. To not remain sedentary   Diet: as you were eating previously.  Can use over the counter stool softeners and bowel preparations, such as Miralax, to help with bowel movements.  Narcotics can  be constipating.  Be sure to drink plenty of fluids  PAIN MEDICATION USE AND EXPECTATIONS  You have likely been given narcotic medications to help control your pain.  After a traumatic event that results in an fracture (broken bone) with or without surgery, it is ok to use narcotic pain medications to help control one's pain.  We understand that everyone responds to pain differently and each individual patient will be evaluated on a regular basis for the continued need for narcotic medications. Ideally, narcotic medication use should last no more than 6-8 weeks (coinciding with fracture healing).   As a patient it is your responsibility as well to monitor narcotic medication use and report the amount and frequency you use these medications when you come to your office visit.   We would also advise that if you are using narcotic medications, you should take a dose prior to therapy to maximize you participation.  IF YOU ARE ON NARCOTIC MEDICATIONS IT IS NOT PERMISSIBLE TO OPERATE A MOTOR VEHICLE (MOTORCYCLE/CAR/TRUCK/MOPED) OR HEAVY MACHINERY DO NOT MIX NARCOTICS WITH OTHER CNS (CENTRAL NERVOUS SYSTEM) DEPRESSANTS SUCH AS ALCOHOL   STOP SMOKING OR USING NICOTINE PRODUCTS!!!!  As discussed nicotine severely impairs your body's ability to heal surgical and traumatic wounds but also impairs bone healing.  Wounds and bone heal by forming microscopic blood vessels (angiogenesis) and nicotine is a vasoconstrictor (essentially, shrinks blood vessels).  Therefore, if vasoconstriction occurs to these microscopic blood vessels they essentially disappear and are unable to  deliver necessary nutrients to the healing tissue.  This is one modifiable factor that you can do to dramatically increase your chances of healing your injury.    (This means no smoking, no nicotine gum, patches, etc)  DO NOT USE NONSTEROIDAL ANTI-INFLAMMATORY DRUGS (NSAID'S)  Using products such as Advil (ibuprofen), Aleve (naproxen), Motrin  (ibuprofen) for additional pain control during fracture healing can delay and/or prevent the healing response.  If you would like to take over the counter (OTC) medication, Tylenol (acetaminophen) is ok.  However, some narcotic medications that are given for pain control contain acetaminophen as well. Therefore, you should not exceed more than 4000 mg of tylenol in a day if you do not have liver disease.  Also note that there are may OTC medicines, such as cold medicines and allergy medicines that my contain tylenol as well.  If you have any questions about medications and/or interactions please ask your doctor/PA or your pharmacist.      ICE AND ELEVATE INJURED/OPERATIVE EXTREMITY  Using ice and elevating the injured extremity above your heart can help with swelling and pain control.  Icing in a pulsatile fashion, such as 20 minutes on and 20 minutes off, can be followed.    Do not place ice directly on skin. Make sure there is a barrier between to skin and the ice pack.    Using frozen items such as frozen peas works well as the conform nicely to the are that needs to be iced.  USE AN ACE WRAP OR TED HOSE FOR SWELLING CONTROL  In addition to icing and elevation, Ace wraps or TED hose are used to help limit and resolve swelling.  It is recommended to use Ace wraps or TED hose until you are informed to stop.    When using Ace Wraps start the wrapping distally (farthest away from the body) and wrap proximally (closer to the body)   Example: If you had surgery on your leg or thing and you do not have a splint on, start the ace wrap at the toes and work your way up to the thigh        If you had surgery on your upper extremity and do not have a splint on, start the ace wrap at your fingers and work your way up to the upper arm  IF YOU ARE IN A SPLINT OR CAST DO NOT [place]   If your splint gets wet for any reason please contact the office immediately. You may shower in your splint or  cast as long as you keep it dry.  This can be done by wrapping in a cast cover or garbage back (or similar)  Do Not stick any thing down your splint or cast such as pencils, money, or hangers to try and scratch yourself with.  If you feel itchy take benadryl as prescribed on the bottle for itching  IF YOU ARE IN A CAM BOOT (BLACK BOOT)  You may remove boot periodically. Perform daily dressing changes as noted below.  Wash the liner of the boot regularly and wear a sock when wearing the boot. It is recommended that you sleep in the boot until told otherwise  CALL THE OFFICE WITH ANY QUESTIONS OR CONCERNS: [phone]

## 2016-10-20 NOTE — Plan of Care (Signed)
Problem: Safety: Goal: Ability to remain free from injury will improve Patient's belongings and call light within reach. Patient call for assistance when needed.

## 2016-10-21 LAB — CBC
HEMATOCRIT: 28 % — AB (ref 39.0–52.0)
HEMOGLOBIN: 9.6 g/dL — AB (ref 13.0–17.0)
MCH: 28.7 pg (ref 26.0–34.0)
MCHC: 34.3 g/dL (ref 30.0–36.0)
MCV: 83.8 fL (ref 78.0–100.0)
Platelets: 255 10*3/uL (ref 150–400)
RBC: 3.34 MIL/uL — ABNORMAL LOW (ref 4.22–5.81)
RDW: 14.1 % (ref 11.5–15.5)
WBC: 6.4 10*3/uL (ref 4.0–10.5)

## 2016-10-21 LAB — COMPREHENSIVE METABOLIC PANEL
ALBUMIN: 2.9 g/dL — AB (ref 3.5–5.0)
ALK PHOS: 45 U/L (ref 38–126)
ALT: 27 U/L (ref 17–63)
ANION GAP: 7 (ref 5–15)
AST: 38 U/L (ref 15–41)
BILIRUBIN TOTAL: 1.1 mg/dL (ref 0.3–1.2)
BUN: 12 mg/dL (ref 6–20)
CALCIUM: 8.9 mg/dL (ref 8.9–10.3)
CO2: 29 mmol/L (ref 22–32)
Chloride: 100 mmol/L — ABNORMAL LOW (ref 101–111)
Creatinine, Ser: 1 mg/dL (ref 0.61–1.24)
GFR calc Af Amer: >60 mL/min (ref >60)
GFR calc non Af Amer: >60 mL/min (ref >60)
GLUCOSE: 125 mg/dL — AB (ref 65–99)
Potassium: 4.1 mmol/L (ref 3.5–5.1)
Sodium: 136 mmol/L (ref 135–145)
TOTAL PROTEIN: 6.6 g/dL (ref 6.5–8.1)

## 2016-10-21 LAB — GLUCOSE, CAPILLARY
GLUCOSE-CAPILLARY: 150 mg/dL — AB (ref 65–99)
GLUCOSE-CAPILLARY: 144 mg/dL — AB (ref 65–99)
Glucose-Capillary: 125 mg/dL — ABNORMAL HIGH (ref 65–99)
Glucose-Capillary: 111 mg/dL — ABNORMAL HIGH (ref 65–99)
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
## 2016-10-21 NOTE — Plan of Care (Signed)
Problem: Safety: Goal: Ability to remain free from injury will improve Outcome: Progressing No fall or injury noted  Problem: Pain Managment: Goal: General experience of comfort will improve Outcome: Progressing Medicated for pain with moderate relief  Problem: Tissue Perfusion: Goal: Risk factors for ineffective tissue perfusion will decrease Outcome: Progressing No signs of DVT noted, SCDs are on  Problem: Activity: Goal: Risk for activity intolerance will decrease Outcome: Progressing Minimum activity tolerance

## 2016-10-21 NOTE — Progress Notes (Signed)
Patient ID: Kevin Garrett, male   DOB: 05-17-1975, 41 y.o.   MRN: [id number]     Subjective:  Patient reports pain as mild to moderate.  Patient requesting more pain meds for his head not for the humerus  Objective:   VITALS:   Vitals:   10/19/16 2027 10/20/16 0420 10/20/16 1903 10/21/16 0500  BP: (!) 146/80 131/78 121/71 (!) 128/93  Pulse: 90 84 76 78  Resp: 19 18    Temp: 99.8 F (37.7 C) 98.3 F (36.8 C) 98.9 F (37.2 C) 98.4 F (36.9 C)  TempSrc: Axillary Oral Oral Oral  SpO2: 100% 100% 100% 100%  Weight:      Height:        ABD soft Sensation intact distally Dorsiflexion/Plantar flexion intact Incision: dressing C/D/I and no drainage  Head lac looks good no sign of infection Staples look fine Sling in place  Lab Results  Component Value Date   WBC 6.4 10/21/2016   HGB 9.6 (L) 10/21/2016   HCT 28.0 (L) 10/21/2016   MCV 83.8 10/21/2016   PLT 255 10/21/2016   BMET    Component Value Date/Time   NA 136 10/21/2016 0308   NA 139 05/03/2012 1710   K 4.1 10/21/2016 0308   K 3.9 05/03/2012 1710   CL 100 (L) 10/21/2016 0308   CL 107 05/03/2012 1710   CO2 29 10/21/2016 0308   CO2 25 05/03/2012 1710   GLUCOSE 125 (H) 10/21/2016 0308   GLUCOSE 106 (H) 05/03/2012 1710   BUN 12 10/21/2016 0308   BUN 13 05/03/2012 1710   CREATININE 1.00 10/21/2016 0308   CREATININE 1.07 05/03/2012 1710   CALCIUM 8.9 10/21/2016 0308   CALCIUM 8.3 (L) 05/03/2012 1710   GFRNONAA >60 10/21/2016 0308   GFRNONAA >60 05/03/2012 1710   GFRAA >60 10/21/2016 0308   GFRAA >60 05/03/2012 1710     Assessment/Plan: 4 Days Post-Op   Principal Problem:   Closed displaced segmental fracture of shaft of left humerus Active Problems:   Closed left scapular fracture   Hypertension   Diabetes mellitus without complication (HCC)   Sleep apnea   Nicotine dependence   Vitamin D deficiency   Advance diet Up with therapy Plan for discharge tomorrow or Monday Sling at all times NWB  left upper  Dry dressing PRN   DOUGLAS PARRY, BRANDON 10/21/2016, 10:52 AM  Discussed and agree with above.   Marchia Bond, MD Cell [phone]

## 2016-10-22 LAB — GLUCOSE, CAPILLARY
Glucose-Capillary: 130 mg/dL — ABNORMAL HIGH (ref 65–99)
Glucose-Capillary: 139 mg/dL — ABNORMAL HIGH (ref 65–99)
Glucose-Capillary: 115 mg/dL — ABNORMAL HIGH (ref 65–99)
Glucose-Capillary: 116 mg/dL — ABNORMAL HIGH (ref 65–99)

## 2016-10-22 NOTE — Evaluation (Addendum)
Occupational Therapy Treatment Patient Details Name: Kevin Garrett MRN: [id number] DOB: 04/29/1975 Today's Date: 10/22/2016    History of Present Illness Pt is a 41 y.o. male s/p L scapula and L distal humerus fractures. Now s/p ORIF L humerus fx. PMHx: Anxiety, DM, HTN.   Clinical Impression   Pt demonstrating increased motivation to participate in therapy. Pt performed LUE exercises including pendulum exercises. Pt benefits from wearing sling while performing pendulum to help with comfort and maximize support. Pt performed tub transfer with Min guard A for safety. Pt would benefit form further OT to optimize safety and independence. Pt currently concern about dc home tomorrow and verbalized his concern. Will continue to follow acutely to increase pt safety and independence with ADLs.     Follow Up Recommendations  Home health OT    Equipment Recommendations  3 in 1 bedside commode    Recommendations for Other Services PT consult     Precautions / Restrictions Precautions Precautions: Shoulder Type of Shoulder Precautions: A/PROM elbow/wrist/hand OK. A/PROM shoulder flexion/extension OK. NO active shoulder ABduction, passive OK. Pendulums with or without sling OK. Shoulder Interventions: Shoulder sling/immobilizer Required Braces or Orthoses: Sling Restrictions Weight Bearing Restrictions: Yes LUE Weight Bearing: Non weight bearing      Mobility Bed Mobility Overal bed mobility: Needs Assistance Bed Mobility: Supine to Sit;Sit to Supine     Supine to sit: Min assist;HOB elevated (Min A to support LUE) Sit to supine: HOB elevated;Min assist (Min A to support LUE)   General bed mobility comments: Pt requiring Min A to support LUE. Max A to position in bed with pillows for support  Transfers Overall transfer level: Needs assistance Equipment used: None Transfers: Sit to/from Stand Sit to Stand: Min guard         General transfer comment: VCs for weight shifting     Balance Overall balance assessment: Needs assistance Sitting-balance support: Feet supported;No upper extremity supported Sitting balance-Leahy Scale: Good     Standing balance support: No upper extremity supported;During functional activity Standing balance-Leahy Scale: Fair                             ADL either performed or assessed with clinical judgement   ADL Overall ADL's : Needs assistance/impaired                                 Tub/ Shower Transfer: Tub transfer;Min guard;Ambulation Tub/Shower Transfer Details (indicate cue type and reason): Educated pt on tub transfer with [employer] A for safety.  Functional mobility during ADLs: Min guard General ADL Comments: Focused session on UE excercises. Educated pt on tub transfer to increase safety. Pt very concerned about going home without assistance. Will follow up acutely to increase pt safety and independence with ADLs. Continues to require Max A for ADLs.     Vision         Perception     Praxis      Pertinent Vitals/Pain Pain Assessment: Faces Faces Pain Scale: Hurts whole lot Pain Location: shoulder Pain Descriptors / Indicators: Aching;Grimacing;Guarding Pain Intervention(s): Monitored during session;Repositioned;Ice applied     Hand Dominance     Extremity/Trunk Assessment             Communication     Cognition Arousal/Alertness: Awake/alert Behavior During Therapy: Anxious Overall Cognitive Status: Within Functional Limits for tasks assessed  General Comments: Pt verbalizing that he did not feel ready for dc and was anxious about being home alone with just with daughter   General Comments  Pt mother present for session    Exercises Exercises: Other exercises;Shoulder General Exercises - Upper Extremity Elbow Flexion: Self ROM;Left;10 reps;Seated (limited ROM ~10 degrees total) Wrist Flexion: AROM;Left;10  reps;Seated Digit Composite Flexion: AROM;Left;10 reps;Seated Shoulder Exercises Pendulum Exercise: PROM;Left;10 reps;Standing;Other (comment) (While wearing sling for support) Shoulder Flexion: AAROM;Left;5 reps;Standing Shoulder Extension: AAROM;Left;5 reps;Seated Elbow Flexion: AAROM;Left;10 reps;Standing Elbow Extension: AAROM;Left;10 reps;Standing Wrist Flexion: AROM;20 reps;Seated;Left Wrist Extension: AROM;20 reps;Left;Seated Digit Composite Flexion: AROM;Left;20 reps;Seated Composite Extension: AROM;Left;Supine;Seated   Shoulder Instructions      Home Living                                          Prior Functioning/Environment                   OT Problem List:        OT Treatment/Interventions:      OT Goals(Current goals can be found in the care plan section) Acute Rehab OT Goals Patient Stated Goal: go to rehab to get better, likes to fish OT Goal Formulation: With patient Time For Goal Achievement: 11/02/16 Potential to Achieve Goals: Good ADL Goals Pt Will Perform Upper Body Bathing: with supervision;sitting Pt Will Perform Lower Body Bathing: with supervision;sit to/from stand;with adaptive equipment Pt Will Transfer to Toilet: with supervision;ambulating;regular height toilet Pt Will Perform Toileting - Clothing Manipulation and hygiene: with supervision;sit to/from stand Additional ADL Goal #1: Pt will don/doff sling with set up as precursor to ADL. Additional ADL Goal #2: Pt will independently perform LUE pendulums and elbow/wrist/hand AROM exercises.  OT Frequency: Min 3X/week   Barriers to D/C:            Co-evaluation              AM-PAC PT "6 Clicks" Daily Activity     Outcome Measure Help from another person eating meals?: A Little Help from another person taking care of personal grooming?: A Little Help from another person toileting, which includes using toliet, bedpan, or urinal?: A Little Help from another  person bathing (including washing, rinsing, drying)?: A Lot Help from another person to put on and taking off regular upper body clothing?: A Lot Help from another person to put on and taking off regular lower body clothing?: A Lot 6 Click Score: 15   End of Session Equipment Utilized During Treatment: Other (comment) (sling) Nurse Communication: Other (comment) (pt. anxious and tearful concerned with dispo)  Activity Tolerance: Patient limited by pain Patient left: in chair;with call bell/phone within reach  OT Visit Diagnosis: Unsteadiness on feet (R26.81);Pain Pain - Right/Left: Left Pain - part of body: Shoulder                Time: 7672-0947 OT Time Calculation (min): 30 min Charges:  OT General Charges $OT Visit: 1 Procedure OT Treatments $Self Care/Home Management : 8-22 mins $Therapeutic Activity: 8-22 mins G-Codes:     [place], OTR/L Acute Rehab Pager: [phone] Office: [place] 10/22/2016, 3:24 PM

## 2016-10-22 NOTE — Progress Notes (Signed)
Patient ID: Kevin Garrett, male   DOB: Jun 05, 1975, 41 y.o.   MRN: [id number]     Subjective:  Patient reports pain as mild to moderate.  Patient slow to mobilize and having increased pain   Objective:   VITALS:   Vitals:   10/21/16 1402 10/21/16 2100 10/22/16 0500 10/22/16 1537  BP: (!) 138/93 137/74 (!) 141/83 129/77  Pulse: 84 78 73 79  Resp: 18 18 18 18   Temp: 98.6 F (37 C) 98.3 F (36.8 C) 98.1 F (36.7 C) 98.4 F (36.9 C)  TempSrc: Oral Oral Oral Oral  SpO2: 100% 100% 100% 100%  Weight:      Height:        ABD soft Sensation intact distally Dorsiflexion/Plantar flexion intact Incision: dressing C/D/I and scant drainage   Lab Results  Component Value Date   WBC 6.4 10/21/2016   HGB 9.6 (L) 10/21/2016   HCT 28.0 (L) 10/21/2016   MCV 83.8 10/21/2016   PLT 255 10/21/2016   BMET    Component Value Date/Time   NA 136 10/21/2016 0308   NA 139 05/03/2012 1710   K 4.1 10/21/2016 0308   K 3.9 05/03/2012 1710   CL 100 (L) 10/21/2016 0308   CL 107 05/03/2012 1710   CO2 29 10/21/2016 0308   CO2 25 05/03/2012 1710   GLUCOSE 125 (H) 10/21/2016 0308   GLUCOSE 106 (H) 05/03/2012 1710   BUN 12 10/21/2016 0308   BUN 13 05/03/2012 1710   CREATININE 1.00 10/21/2016 0308   CREATININE 1.07 05/03/2012 1710   CALCIUM 8.9 10/21/2016 0308   CALCIUM 8.3 (L) 05/03/2012 1710   GFRNONAA >60 10/21/2016 0308   GFRNONAA >60 05/03/2012 1710   GFRAA >60 10/21/2016 0308   GFRAA >60 05/03/2012 1710     Assessment/Plan: 5 Days Post-Op   Principal Problem:   Closed displaced segmental fracture of shaft of left humerus Active Problems:   Closed left scapular fracture   Hypertension   Diabetes mellitus without complication (HCC)   Sleep apnea   Nicotine dependence   Vitamin D deficiency   Advance diet Up with therapy Plan for discharge tomorrow Sling at all times NWB left upper ext   ROCHESTER, SERPE 10/22/2016, 5:27 PM  Discussed and agree with above.    Marchia Bond, MD Cell [phone]

## 2016-10-23 LAB — GLUCOSE, CAPILLARY
Glucose-Capillary: 112 mg/dL — ABNORMAL HIGH (ref 65–99)
Glucose-Capillary: 120 mg/dL — ABNORMAL HIGH (ref 65–99)
Glucose-Capillary: 142 mg/dL — ABNORMAL HIGH (ref 65–99)

## 2016-10-23 NOTE — Progress Notes (Addendum)
Physical Therapy Treatment Patient Details Name: Kevin Garrett MRN: [id number] DOB: 1976-01-12 Today's Date: 10/23/2016    History of Present Illness Pt is a 41 y.o. male s/p L scapula and L distal humerus fractures. Now s/p ORIF L humerus fx. PMHx: Anxiety, DM, HTN.    PT Comments    Pt is ambulating in hall with no AD and no LOB. Need to continue to focus on bed mobilities, as pt is heavily dependent on bed rails to mobilize. Pt mobilized in bed with no physical assist but very labored effort and requires frequent cues to breathe. Pt reports he black outs every time he rises from supine to sit, no LOB noted with transition. Patient would benefit from continued skilled PT to increase functional indepencence. Will continue to follow acutely.     Follow Up Recommendations  Supervision for mobility/OOB;SNF (SNF vs HHPT depending on progress)     Equipment Recommendations  Cane    Recommendations for Other Services       Precautions / Restrictions Precautions Precautions: Shoulder Type of Shoulder Precautions: A/PROM elbow/wrist/hand OK. A/PROM shoulder flexion/extension OK. NO active shoulder ABduction, passive OK. Pendulums with or without sling OK. Shoulder Interventions: Shoulder sling/immobilizer Required Braces or Orthoses: Sling Restrictions Weight Bearing Restrictions: Yes LUE Weight Bearing: Non weight bearing    Mobility  Bed Mobility Overal bed mobility: Needs Assistance Bed Mobility: Supine to Sit;Sit to Supine     Supine to sit: HOB elevated;Min guard Sit to supine: HOB elevated;Min guard   General bed mobility comments: Min guard and cueing for technique. Pt required hand rails to mobilize in bed. Very labored but no assist required. Encouraged pt to refrain from using the trapeze bar and instead utalize LEs to scoot in bed. Cue to breathe. Pt reports blacking out whenever he rises from supine to sit.  Transfers Overall transfer level: Needs  assistance Equipment used: None Transfers: Sit to/from Stand Sit to Stand: Min guard         General transfer comment: Min guard for safety  Ambulation/Gait Ambulation/Gait assistance: Supervision Ambulation Distance (Feet): 370 Feet Assistive device: None Gait Pattern/deviations: Step-through pattern;Decreased step length - right;Decreased step length - left;Wide base of support     General Gait Details: Pt with wide BOS and overall steady gait.    Stairs            Wheelchair Mobility    Modified Rankin (Stroke Patients Only)       Balance Overall balance assessment: Needs assistance Sitting-balance support: Feet supported;No upper extremity supported Sitting balance-Leahy Scale: Good     Standing balance support: No upper extremity supported;During functional activity Standing balance-Leahy Scale: Good                              Cognition Arousal/Alertness: Awake/alert Behavior During Therapy: WFL for tasks assessed/performed Overall Cognitive Status: Within Functional Limits for tasks assessed        Exercises Total Joint Exercises Bridges: Strengthening;Both;10 reps;Supine (to increase ability to perform bed mobilities)   General Comments        Pertinent Vitals/Pain Pain Assessment: Faces Faces Pain Scale: Hurts whole lot (with movement) Pain Location: shoulder Pain Descriptors / Indicators: Aching;Grimacing;Guarding Pain Intervention(s): Monitored during session;Limited activity within patient's tolerance;Ice applied    Home Living                      Prior Function  PT Goals (current goals can now be found in the care plan section) Acute Rehab PT Goals Patient Stated Goal: go to rehab to get better, likes to fish PT Goal Formulation: With patient Time For Goal Achievement: 11/02/16 Potential to Achieve Goals: Good Progress towards PT goals: Progressing toward goals    Frequency    Min  3X/week      PT Plan Current plan remains appropriate    Co-evaluation              AM-PAC PT "6 Clicks" Daily Activity  Outcome Measure  Difficulty turning over in bed (including adjusting bedclothes, sheets and blankets)?: A Lot Difficulty moving from lying on back to sitting on the side of the bed? : Total Difficulty sitting down on and standing up from a chair with arms (e.g., wheelchair, bedside commode, etc,.)?: Total Help needed moving to and from a bed to chair (including a wheelchair)?: A Little Help needed walking in hospital room?: A Little Help needed climbing 3-5 steps with a railing? : A Little 6 Click Score: 13    End of Session Equipment Utilized During Treatment: Gait belt Activity Tolerance: Patient tolerated treatment well Patient left: in bed;with call bell/phone within reach Nurse Communication: Mobility status PT Visit Diagnosis: Difficulty in walking, not elsewhere classified (R26.2);Pain Pain - Right/Left: Left Pain - part of body: Shoulder     Time: 2060-1561 PT Time Calculation (min) (ACUTE ONLY): 22 min  Charges:  $Gait Training: 8-22 mins                    G Codes:       Benjiman Core, [place] Pager [phone] Acute Rehab   Allena Katz 10/23/2016, 10:57 AM

## 2016-10-23 NOTE — Care Management (Signed)
Case manager received consult  to arrange Home Health for patient. Unfortunately, patient is uninsured and doesn't qualify for charity services under medicaid guidelines. CM advised patient to take his prescriptions to [facility] to be filled, 3in1 has been ordered.

## 2016-10-23 NOTE — Progress Notes (Signed)
Pt discharge education and instructions completed with pt and family at bedside. All voices understanding and denies any questions. Pt IV and telemetry removed; pt discharge home with family to transport him home. LUE remains in sling; head incision remains intact open to air with staples; abrasions scabbed all over. Pt handed his prescriptions for Celexa; colace; oxycodone; percocet; robaxin and Vitamin D. Pt transported off unit via wheelchair with belongings and family to the side. Delia Heady RN
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
## 2016-10-25 ENCOUNTER — Encounter: Payer: Self-pay | Admitting: Emergency Medicine

## 2016-10-25 ENCOUNTER — Emergency Department: Payer: Medicaid Other

## 2016-10-25 ENCOUNTER — Emergency Department
Admission: EM | Admit: 2016-10-25 | Discharge: 2016-10-25 | Disposition: A | Discharge status: Home / Self Care | Payer: Medicaid Other | Attending: Emergency Medicine | Admitting: Emergency Medicine

## 2016-10-25 DIAGNOSIS — R200 Anesthesia of skin: Secondary | ICD-10-CM | POA: Insufficient documentation

## 2016-10-25 DIAGNOSIS — Z4802 Encounter for removal of sutures: Secondary | ICD-10-CM | POA: Insufficient documentation

## 2016-10-25 DIAGNOSIS — F17210 Nicotine dependence, cigarettes, uncomplicated: Secondary | ICD-10-CM | POA: Diagnosis not present

## 2016-10-25 DIAGNOSIS — I10 Essential (primary) hypertension: Secondary | ICD-10-CM | POA: Diagnosis not present

## 2016-10-25 DIAGNOSIS — E119 Type 2 diabetes mellitus without complications: Secondary | ICD-10-CM | POA: Insufficient documentation

## 2016-10-25 DIAGNOSIS — R202 Paresthesia of skin: Secondary | ICD-10-CM

## 2016-10-25 LAB — GLUCOSE, CAPILLARY: GLUCOSE-CAPILLARY: 112 mg/dL — AB (ref 65–99)

## 2016-10-25 MED ORDER — PREDNISONE 10 MG PO TABS: ORAL_TABLET | ORAL | 0 refills | Status: DC

## 2016-10-25 MED ORDER — METHYLPREDNISOLONE SODIUM SUCC 125 MG IJ SOLR
125.0000 mg | Freq: Once | INTRAMUSCULAR | Status: AC
  Administered 2016-10-25: 125 mg via INTRAMUSCULAR
  Filled 2016-10-25: qty 2

## 2016-10-25 NOTE — ED Notes (Signed)
Back from x-ray  Awaiting results    Repositioned for comfort
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
## 2016-11-20 ENCOUNTER — Ambulatory Visit: Payer: Medicaid Other | Attending: Orthopedic Surgery | Admitting: Physical Therapy

## 2016-11-20 DIAGNOSIS — R29898 Other symptoms and signs involving the musculoskeletal system: Secondary | ICD-10-CM | POA: Insufficient documentation

## 2016-11-20 DIAGNOSIS — M79602 Pain in left arm: Secondary | ICD-10-CM | POA: Diagnosis present

## 2016-11-21 NOTE — Therapy (Deleted)
[facility] [address] [place], [address] Phone: [phone]   Fax:  [phone]  Patient Details  Name: Kevin Garrett MRN: [id number] Date of Birth: 1976-01-24 Referring Provider:  Altamese Guthrie, MD  Encounter Date: 11/20/2016   Royce Macadamia 11/21/2016, 4:30 PM  [facility] [address] [place], [address] Phone: [phone]   Fax:  [phone]
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
## 2016-11-27 ENCOUNTER — Ambulatory Visit: Payer: Medicaid Other | Admitting: Physical Therapy

## 2016-11-29 ENCOUNTER — Ambulatory Visit: Payer: Medicaid Other | Admitting: Physical Therapy

## 2016-11-29 DIAGNOSIS — M79602 Pain in left arm: Secondary | ICD-10-CM | POA: Diagnosis not present

## 2016-11-29 DIAGNOSIS — R29898 Other symptoms and signs involving the musculoskeletal system: Secondary | ICD-10-CM

## 2016-11-29 NOTE — Patient Instructions (Signed)
High volt stim to 105 V to anterior and middle deltoid   PROM ER to 50-55 degrees   PROM forward elevation to 110-120 degrees with bent elbow  Attempted AAROM

## 2016-11-30 NOTE — Therapy (Signed)
[facility] [address], [place], [address] Phone: [phone]   Fax:  [phone]  Physical Therapy Treatment  Patient Details  Name: Kevin Garrett MRN: [id number] Date of Birth: 08-24-1975 Referring Provider: Dr. Marcelino Scot  Encounter Date: 11/29/2016      PT End of Session - 11/29/16 1418    Visit Number 2   Number of Visits 25   Date for PT Re-Evaluation 03/13/17   PT Start Time 1346   PT Stop Time 1430   PT Time Calculation (min) 44 min   Activity Tolerance Patient tolerated treatment well;Patient limited by pain   Behavior During Therapy [facility] for tasks assessed/performed      Past Medical History:  Diagnosis Date  . Anxiety   . Diabetes mellitus without complication ([place])   . Hypertension   . Nicotine dependence 10/18/2016  . Sleep apnea   . Vitamin D deficiency 10/18/2016    Past Surgical History:  Procedure Laterality Date  . ORIF HUMERUS FRACTURE Left 10/17/2016   Procedure: OPEN REDUCTION INTERNAL FIXATION (ORIF) LEFT DISTAL HUMERUS FRACTURE;  Surgeon: Altamese Penasco, MD;  Location: [facility];  Service: Orthopedics;  Laterality: Left;    There were no vitals filed for this visit.      Subjective Assessment - 11/29/16 1359    Subjective Patient reports he has been completing his exercises as prescribed, and was doing well but helped himself up with his LUE earlier this week and has had increased pain inhis LUE since that time. He has been compliant with use of sling.    Limitations House hold activities;Lifting   Diagnostic tests Extensive fractures in LUE, repaired via ORIF, no full union seen yet.    Patient Stated Goals To regain functional use of his LUE    Currently in Pain? Yes   Pain Score --  Patient reports an ache in his anterior shoulder from pushing up the other day, moderate to severe at times   Pain Location Shoulder   Pain Orientation Anterior;Left   Pain Descriptors / Indicators  Aching   Pain Type Chronic pain;Surgical pain   Pain Onset More than a month ago   Pain Frequency Intermittent      High volt stim to 105 V to anterior and middle deltoid throughout session to allow for increased tolerance to exercises  PROM ER to 50-55 degrees x 5 bouts x 1-2 minutes of oscillations at end range for each bout (mild pain, but reduced as PT reduced ROM)  PROM forward elevation to 110-120 degrees with bent elbow x 5 bouts x 1-2 minutes per bout through tolerated ROM with at least 2 points of contact and cuing to relax/reduce muscle tension/force  Attempted AAROM with PVC pipe in supine, however patient was too weak/painful to complete appropriately without significant pain or assistance from therapist.                            PT Education - 11/29/16 1412    Education provided Yes   Education Details Significant improvement in ROM, continue with activities to reduce pain (stretching, sling) will see in 2 weeks when he is allowed ot do more active based treatments.    Person(s) Educated Patient   Methods Explanation;Demonstration   Comprehension Verbalized understanding;Returned demonstration          PT Short Term Goals - 11/27/16 0801      PT SHORT  TERM GOAL #1   Title Patient will wear sling and not use LUE for any activities to maintain precautionary activity modifications.    Baseline Patient has been wearing sling but using LUE for minimal activities.    Time 4   Period Weeks   Status New           PT Long Term Goals - 11/27/16 0800      PT LONG TERM GOAL #1   Title Patient will demonstrate at least 150 degrees of active shoulder flexion to complete ADLs.    Baseline Not able to demonstrate at this time due to surgical precautions.    Time 16   Period Weeks   Status New     PT LONG TERM GOAL #2   Title Patient will be able to carry at least 10# in his LUE to complete work related functions.    Baseline Not able to  demonstrate at this time due to surgical precautions.    Time 16   Period Weeks   Status New     PT LONG TERM GOAL #3   Title Patient will report worst pain of less than 3/10 to demonstrate improved tolerance for ADLs.    Baseline Patient reports 7-8/10 at worst.    Time 16   Period Weeks   Status New     PT LONG TERM GOAL #4   Title Patient will be able to lift items <5# overhead with no increase in pain to complete ADLs.    Baseline Not able to demonstrate at this time due to surgical precautions.    Time 16   Period Weeks   Status New               Plan - 11/29/16 1437    Clinical Impression Statement Patient has made good progress with his PROM into ER, though he is still lacking quite a bit of motion (~55 degrees). He is unable to perform even light AAROM today secondary to pain in his LUE,and therefore is more appropriate to continue with passive motiononly. He is able to tolerate passive forward elevation to roughly 110-120 degrees without substantial increase in his symptoms. Discussed return in 2 weeks when his protocol will allow for more active/passive motion.    Clinical Presentation Stable   Clinical Decision Making Moderate   Rehab Potential Good   PT Frequency 2x / week   PT Duration 12 weeks   PT Treatment/Interventions Aquatic Therapy;Cryotherapy;Electrical Stimulation;Therapeutic exercise;Therapeutic activities;Balance training;Taping;Dry needling;Neuromuscular re-education;Patient/family education;Passive range of motion;Ultrasound;Iontophoresis 4mg /ml Dexamethasone   PT Next Visit Plan Progress per protocol    PT Home Exercise Plan Passive ER and passive overhead flexion.    Consulted and Agree with Plan of Care Patient      Patient will benefit from skilled therapeutic intervention in order to improve the following deficits and impairments:  Pain, Abnormal gait, Improper body mechanics, Decreased mobility, Impaired UE functional use, Obesity, Difficulty  walking, Decreased knowledge of precautions, Decreased balance, Decreased activity tolerance, Decreased endurance, Decreased range of motion, Decreased strength  Visit Diagnosis: Pain in left arm  Weakness of left arm     Problem List Patient Active Problem List   Diagnosis Date Noted  . Nicotine dependence 10/18/2016  . Vitamin D deficiency 10/18/2016  . Hypertension   . Diabetes mellitus without complication ([place])   . Sleep apnea   . Closed left scapular fracture 10/15/2016  . Closed displaced segmental fracture of shaft of left humerus 10/15/2016  Royce Macadamia PT, DPT, CSCS    11/30/2016, 10:09 AM  [facility] [address], [place], [address] Phone: [phone]   Fax:  [phone]  Name: Kevin Garrett MRN: [id number] Date of Birth: [birth year]

## 2016-12-06 ENCOUNTER — Ambulatory Visit: Payer: Medicaid Other | Admitting: Physical Therapy

## 2016-12-07 ENCOUNTER — Ambulatory Visit: Payer: Medicaid Other | Admitting: Physical Therapy

## 2016-12-11 ENCOUNTER — Encounter: Payer: Medicaid Other | Admitting: Physical Therapy

## 2016-12-12 ENCOUNTER — Ambulatory Visit: Payer: Medicaid Other | Attending: Orthopedic Surgery | Admitting: Physical Therapy

## 2016-12-12 DIAGNOSIS — R29898 Other symptoms and signs involving the musculoskeletal system: Secondary | ICD-10-CM | POA: Insufficient documentation

## 2016-12-12 DIAGNOSIS — M79602 Pain in left arm: Secondary | ICD-10-CM | POA: Insufficient documentation

## 2016-12-12 NOTE — Patient Instructions (Addendum)
L AROM flexion to 28 degrees   AAROM in supine with PVC, too painful. PT performed AAROM with PT providing majority of assistance secondary to pain and weakness  Passive ER stretching   Passive/AAROM flexion  Passive/AAROM chest press   Prom er - 28

## 2016-12-12 NOTE — Therapy (Signed)
[facility] [address], [place], [address] Phone: [phone]   Fax:  [phone]  Physical Therapy Treatment  Patient Details  Name: Kevin Garrett MRN: [id number] Date of Birth: [birth year] Referring Provider: Dr. Marcelino Scot  Encounter Date: 12/12/2016      PT End of Session - 12/12/16 1531    Visit Number 3   Number of Visits 25   Date for PT Re-Evaluation 03/13/17   PT Start Time 1518   PT Stop Time 1559   PT Time Calculation (min) 41 min   Activity Tolerance Patient tolerated treatment well;Patient limited by pain   Behavior During Therapy [facility] for tasks assessed/performed      Past Medical History:  Diagnosis Date  . Anxiety   . Diabetes mellitus without complication ([place])   . Hypertension   . Nicotine dependence 10/18/2016  . Sleep apnea   . Vitamin D deficiency 10/18/2016    Past Surgical History:  Procedure Laterality Date  . ORIF HUMERUS FRACTURE Left 10/17/2016   Procedure: OPEN REDUCTION INTERNAL FIXATION (ORIF) LEFT DISTAL HUMERUS FRACTURE;  Surgeon: Altamese Ramah, MD;  Location: [facility];  Service: Orthopedics;  Laterality: Left;    There were no vitals filed for this visit.      Subjective Assessment - 12/12/16 1519    Subjective Patient reports his forearm on the ulnar side has been very painful and sensitive. He saw his MD yesterday who wants him to take the sling off.    Limitations House hold activities;Lifting   Diagnostic tests Extensive fractures in LUE, repaired via ORIF, no full union seen yet.    Patient Stated Goals To regain functional use of his LUE    Currently in Pain? Yes   Pain Score --  Patient reports his ulnar side of forearm is very painful and sensistive, his arm has been aching more than it has over the weekend as well, but doesn't rate.   Pain Location Arm   Pain Orientation Left   Pain Descriptors / Indicators Aching   Pain Onset More than a month ago   Pain  Frequency Constant        L AROM flexion to 28 degrees   AAROM in supine with PVC, too painful. PT performed AAROM with PT providing majority of assistance secondary to pain and weakness   Provided High volt continuous E-stim to anterior and medial deltoid and over infraspinatus at 95V, well tolerated.   Passive ER stretching x 5 bouts for 1 minute per bout through tolerated ROM with gentle oscillations at end range with discomfort noted, and PT backed off at that point  Passive/AAROM flexion through tolerated ROM (to roughly 120-130 degrees) with 3 points of contact x 45" for 3 bouts   Passive/AAROM chest press x 10 repetitions for 3 sets with PT assiting to allow for more full ROM with pain control  Prom ER - 63                          PT Education - 12/12/16 1538    Education provided Yes   Education Details Progressed HEP to include AAROM shoulder flexion and seated passive shoulder flexion,    Person(s) Educated Patient   Methods Explanation;Demonstration   Comprehension Verbalized understanding;Returned demonstration          PT Short Term Goals - 11/27/16 0801      PT SHORT TERM GOAL #1  Title Patient will wear sling and not use LUE for any activities to maintain precautionary activity modifications.    Baseline Patient has been wearing sling but using LUE for minimal activities.    Time 4   Period Weeks   Status New           PT Long Term Goals - 11/27/16 0800      PT LONG TERM GOAL #1   Title Patient will demonstrate at least 150 degrees of active shoulder flexion to complete ADLs.    Baseline Not able to demonstrate at this time due to surgical precautions.    Time 16   Period Weeks   Status New     PT LONG TERM GOAL #2   Title Patient will be able to carry at least 10# in his LUE to complete work related functions.    Baseline Not able to demonstrate at this time due to surgical precautions.    Time 16   Period Weeks    Status New     PT LONG TERM GOAL #3   Title Patient will report worst pain of less than 3/10 to demonstrate improved tolerance for ADLs.    Baseline Patient reports 7-8/10 at worst.    Time 16   Period Weeks   Status New     PT LONG TERM GOAL #4   Title Patient will be able to lift items <5# overhead with no increase in pain to complete ADLs.    Baseline Not able to demonstrate at this time due to surgical precautions.    Time 16   Period Weeks   Status New               Plan - 12/12/16 1817    Clinical Impression Statement Patient is now appropriate to be without his sling, his LUE is quite weak and is unable to flex beyond roughly 28 degrees secondary to pain and weakness.  He is encouraged to remove his sling to begin strengthening of glenohumeral musculature. He is able to tolerate AAROM this date, but requires heavy assistance secondary to pain. He is progressing slowly due to precautions, but will be strongly encouraged to work on [employer] in [employer].    Clinical Presentation Stable   Clinical Decision Making Moderate   Rehab Potential Good   PT Frequency 2x / week   PT Duration 12 weeks   PT Treatment/Interventions Aquatic Therapy;Cryotherapy;Electrical Stimulation;Therapeutic exercise;Therapeutic activities;Balance training;Taping;Dry needling;Neuromuscular re-education;Patient/family education;Passive range of motion;Ultrasound;Iontophoresis 4mg /ml Dexamethasone   PT Next Visit Plan Progress per protocol    PT Home Exercise Plan Passive ER and passive overhead flexion.    Consulted and Agree with Plan of Care Patient      Patient will benefit from skilled therapeutic intervention in order to improve the following deficits and impairments:  Pain, Abnormal gait, Improper body mechanics, Decreased mobility, Impaired UE functional use, Obesity, Difficulty walking, Decreased knowledge of precautions, Decreased balance, Decreased activity tolerance, Decreased endurance, Decreased  range of motion, Decreased strength  Visit Diagnosis: Pain in left arm  Weakness of left arm     Problem List Patient Active Problem List   Diagnosis Date Noted  . Nicotine dependence 10/18/2016  . Vitamin D deficiency 10/18/2016  . Hypertension   . Diabetes mellitus without complication ([place])   . Sleep apnea   . Closed left scapular fracture 10/15/2016  . Closed displaced segmental fracture of shaft of left humerus 10/15/2016   Royce Macadamia PT, DPT, CSCS  12/13/2016, 11:37 AM  [facility] [address] [place], [address] Phone: [phone]   Fax:  [phone]  Name: Kevin Garrett MRN: [id number] Date of Birth: Jul 28, 1975

## 2016-12-13 ENCOUNTER — Encounter: Payer: Medicaid Other | Admitting: Physical Therapy

## 2016-12-18 ENCOUNTER — Encounter: Payer: Medicaid Other | Admitting: Physical Therapy

## 2016-12-18 ENCOUNTER — Ambulatory Visit: Payer: Medicaid Other | Admitting: Physical Therapy

## 2016-12-18 DIAGNOSIS — M79602 Pain in left arm: Secondary | ICD-10-CM

## 2016-12-18 DIAGNOSIS — R29898 Other symptoms and signs involving the musculoskeletal system: Secondary | ICD-10-CM

## 2016-12-18 NOTE — Patient Instructions (Addendum)
PROM into ER to 75 degrees at roughly 70 degrees of abduction   Chest press AAROM x 15, for second set largely cga x 15 -- 3 sets  AROM flexion to 45 degrees   Patient was having a difficult time tolerating session, thus added E-stim high volt continuous to anterior shoulder and infraspinatus to tolerance (130V) with good pain relief following   Small guided arcs 70-110 starting at 90 with rhythmic stab at 90 x 2 minutes for 2 bouts at slow speed, 2 bouts at faster speeds as tolerated  Soft tissue mobilization to distal triceps and medial forearm musculature with positive response noted, patient reported decreased pain with guided arcs

## 2016-12-18 NOTE — Therapy (Signed)
[facility] [address] [place], [address] Phone: [phone]   Fax:  [phone]  Physical Therapy Treatment  Patient Details  Name: Kevin Garrett MRN: [id number] Date of Birth: 06-12-1975 Referring Provider: Dr. Marcelino Scot  Encounter Date: 12/18/2016      PT End of Session - 12/18/16 1455    Visit Number 4   Number of Visits 25   Date for PT Re-Evaluation 03/13/17   PT Start Time 1430   PT Stop Time 1515   PT Time Calculation (min) 45 min   Activity Tolerance Patient tolerated treatment well;Patient limited by pain   Behavior During Therapy [facility] for tasks assessed/performed      Past Medical History:  Diagnosis Date  . Anxiety   . Diabetes mellitus without complication ([place])   . Hypertension   . Nicotine dependence 10/18/2016  . Sleep apnea   . Vitamin D deficiency 10/18/2016    Past Surgical History:  Procedure Laterality Date  . ORIF HUMERUS FRACTURE Left 10/17/2016   Procedure: OPEN REDUCTION INTERNAL FIXATION (ORIF) LEFT DISTAL HUMERUS FRACTURE;  Surgeon: Altamese , MD;  Location: [facility];  Service: Orthopedics;  Laterality: Left;    There were no vitals filed for this visit.      Subjective Assessment - 12/18/16 1433    Subjective Patient reports his medial forearm has not been as sore as it was. He has continued to have difficulty lifting his LUE or using it in any functional way. He still uses the sling intermittently for pain control.    Limitations House hold activities;Lifting   Diagnostic tests Extensive fractures in LUE, repaired via ORIF, no full union seen yet.    Patient Stated Goals To regain functional use of his LUE    Currently in Pain? Yes   Pain Score --  Patient does not rate pain, but reports forearm is feeling better   Pain Location Arm   Pain Orientation Left;Proximal   Pain Descriptors / Indicators Aching   Pain Type Chronic pain;Surgical pain   Pain Onset More than  a month ago   Pain Frequency Constant      PROM into ER to 75 degrees at roughly 70 degrees of abduction   Chest press AAROM x 15, for second set largely cga x 15 -- 3 sets  AROM flexion to 45 degrees   Patient was having a difficult time tolerating session, thus added E-stim high volt continuous to anterior shoulder and infraspinatus to tolerance (130V) with good pain relief following   Small guided arcs 70-110 starting at 90 with rhythmic stab at 90 x 2 minutes for 2 bouts at slow speed, 2 bouts at faster speeds as tolerated  Soft tissue mobilization to distal triceps and medial forearm musculature with positive response noted, patient reported decreased pain with guided arcs  PROM forward elevation to roughly 120-135 degrees as tolerated x 2 bouts x 2-3 minutes per bout                            PT Education - 12/18/16 1454    Education provided Yes   Education Details Continuing to progress nicely with ROM and strength.    Person(s) Educated Patient   Methods Explanation;Demonstration   Comprehension Verbalized understanding;Returned demonstration          PT Short Term Goals - 11/27/16 0801      PT SHORT TERM GOAL #  1   Title Patient will wear sling and not use LUE for any activities to maintain precautionary activity modifications.    Baseline Patient has been wearing sling but using LUE for minimal activities.    Time 4   Period Weeks   Status New           PT Long Term Goals - 11/27/16 0800      PT LONG TERM GOAL #1   Title Patient will demonstrate at least 150 degrees of active shoulder flexion to complete ADLs.    Baseline Not able to demonstrate at this time due to surgical precautions.    Time 16   Period Weeks   Status New     PT LONG TERM GOAL #2   Title Patient will be able to carry at least 10# in his LUE to complete work related functions.    Baseline Not able to demonstrate at this time due to surgical precautions.     Time 16   Period Weeks   Status New     PT LONG TERM GOAL #3   Title Patient will report worst pain of less than 3/10 to demonstrate improved tolerance for ADLs.    Baseline Patient reports 7-8/10 at worst.    Time 16   Period Weeks   Status New     PT LONG TERM GOAL #4   Title Patient will be able to lift items <5# overhead with no increase in pain to complete ADLs.    Baseline Not able to demonstrate at this time due to surgical precautions.    Time 16   Period Weeks   Status New               Plan - 12/18/16 1455    Clinical Impression Statement Patient is demonstrating improving PROM into both flexion (120-135 degees) and ER (to 75 degrees). He is still quite limited secondary to pain/weakness with active motions, though this continues to improve week by week. He will continue to be progressed with strengthening and motion as tolerated.    Clinical Presentation Stable   Clinical Decision Making Moderate   Rehab Potential Good   PT Frequency 2x / week   PT Duration 12 weeks   PT Treatment/Interventions Aquatic Therapy;Cryotherapy;Electrical Stimulation;Therapeutic exercise;Therapeutic activities;Balance training;Taping;Dry needling;Neuromuscular re-education;Patient/family education;Passive range of motion;Ultrasound;Iontophoresis 4mg /ml Dexamethasone   PT Next Visit Plan Progress per protocol    PT Home Exercise Plan Passive ER and passive overhead flexion.    Consulted and Agree with Plan of Care Patient      Patient will benefit from skilled therapeutic intervention in order to improve the following deficits and impairments:  Pain, Abnormal gait, Improper body mechanics, Decreased mobility, Impaired UE functional use, Obesity, Difficulty walking, Decreased knowledge of precautions, Decreased balance, Decreased activity tolerance, Decreased endurance, Decreased range of motion, Decreased strength  Visit Diagnosis: Pain in left arm  Weakness of left  arm     Problem List Patient Active Problem List   Diagnosis Date Noted  . Nicotine dependence 10/18/2016  . Vitamin D deficiency 10/18/2016  . Hypertension   . Diabetes mellitus without complication ([place])   . Sleep apnea   . Closed left scapular fracture 10/15/2016  . Closed displaced segmental fracture of shaft of left humerus 10/15/2016   Royce Macadamia PT, DPT, CSCS    12/18/2016, 3:21 PM  [facility] [address] [place], [address] Phone: [phone]   Fax:  [phone]  Name: Kevin Garrett MRN: [id number] Date of Birth: January 15, 1976

## 2016-12-20 ENCOUNTER — Encounter: Payer: Medicaid Other | Admitting: Physical Therapy

## 2016-12-20 ENCOUNTER — Ambulatory Visit: Payer: Medicaid Other | Admitting: Physical Therapy

## 2016-12-20 DIAGNOSIS — M79602 Pain in left arm: Secondary | ICD-10-CM | POA: Diagnosis not present

## 2016-12-20 DIAGNOSIS — R29898 Other symptoms and signs involving the musculoskeletal system: Secondary | ICD-10-CM

## 2016-12-20 NOTE — Patient Instructions (Addendum)
High volt stimulation 130V to anterior deltoid and infraspinatus AAROM and PROM motions through low ranges 30-90 degrees of forward elevation and 80-110 degrees x 15 per set for 3 sets   Chest press - with no added weight (AAROM x 10) -- can now complete without assistance x 5 repetitions x 8 reps with very min A for 3 sets  AAROM with dowel x 8 repetitions, x10 through roughly 70-80 degrees in supine x 10 repetitions for this in 2 sets  AAROM with dowel from 90 degrees up through 110-120 degrees x 15 repetitions with PT guiding motion for 3 sets

## 2016-12-20 NOTE — Therapy (Signed)
[facility] [address], [place], [address] Phone: [phone]   Fax:  [phone]  Physical Therapy Treatment  Patient Details  Name: Kevin Garrett MRN: [id number] Date of Birth: Jan 13, 1976 Referring Provider: Dr. Marcelino Scot  Encounter Date: 12/20/2016      PT End of Session - 12/20/16 1324    Visit Number 5   Number of Visits 25   Date for PT Re-Evaluation 03/13/17   PT Start Time 6644   PT Stop Time 1342   PT Time Calculation (min) 39 min   Activity Tolerance Patient tolerated treatment well;Patient limited by pain   Behavior During Therapy [facility] for tasks assessed/performed      Past Medical History:  Diagnosis Date  . Anxiety   . Diabetes mellitus without complication ([place])   . Hypertension   . Nicotine dependence 10/18/2016  . Sleep apnea   . Vitamin D deficiency 10/18/2016    Past Surgical History:  Procedure Laterality Date  . ORIF HUMERUS FRACTURE Left 10/17/2016   Procedure: OPEN REDUCTION INTERNAL FIXATION (ORIF) LEFT DISTAL HUMERUS FRACTURE;  Surgeon: Altamese [place], MD;  Location: [facility];  Service: Orthopedics;  Laterality: Left;    There were no vitals filed for this visit.      Subjective Assessment - 12/20/16 1311    Subjective Patient reports his shoulder is feeling pretty good today, he is still having trouble with using it actively.    Limitations House hold activities;Lifting   Diagnostic tests Extensive fractures in LUE, repaired via ORIF, no full union seen yet.    Patient Stated Goals To regain functional use of his LUE    Currently in Pain? Yes   Pain Location Arm   Pain Orientation Left;Mid   Pain Descriptors / Indicators Aching   Pain Type Chronic pain;Surgical pain   Pain Onset More than a month ago   Pain Frequency Intermittent      High volt stimulation 130V to anterior deltoid and infraspinatus AAROM and PROM motions through low ranges 30-90 degrees of forward  elevation and 80-110 degrees x 15 per set for 3 sets   Chest press - with no added weight (AAROM x 10) -- can now complete without assistance x 5 repetitions x 8 reps with very min A for 3 sets  AAROM with dowel x 8 repetitions, x10 through roughly 70-80 degrees in supine x 10 repetitions for this in 2 sets  AAROM with dowel from 90 degrees up through 110-120 degrees x 15 repetitions with PT guiding motion for 3 sets   AROM to 50 degrees initially to begin session                            PT Education - 12/20/16 1334    Education provided Yes   Education Details Continues to progress with strength, need to continue with HEP.    Person(s) Educated Patient   Methods Explanation;Demonstration   Comprehension Verbalized understanding;Returned demonstration          PT Short Term Goals - 11/27/16 0801      PT SHORT TERM GOAL #1   Title Patient will wear sling and not use LUE for any activities to maintain precautionary activity modifications.    Baseline Patient has been wearing sling but using LUE for minimal activities.    Time 4   Period Weeks   Status New  PT Long Term Goals - 11/27/16 0800      PT LONG TERM GOAL #1   Title Patient will demonstrate at least 150 degrees of active shoulder flexion to complete ADLs.    Baseline Not able to demonstrate at this time due to surgical precautions.    Time 16   Period Weeks   Status New     PT LONG TERM GOAL #2   Title Patient will be able to carry at least 10# in his LUE to complete work related functions.    Baseline Not able to demonstrate at this time due to surgical precautions.    Time 16   Period Weeks   Status New     PT LONG TERM GOAL #3   Title Patient will report worst pain of less than 3/10 to demonstrate improved tolerance for ADLs.    Baseline Patient reports 7-8/10 at worst.    Time 16   Period Weeks   Status New     PT LONG TERM GOAL #4   Title Patient will be able to  lift items <5# overhead with no increase in pain to complete ADLs.    Baseline Not able to demonstrate at this time due to surgical precautions.    Time 16   Period Weeks   Status New               Plan - 12/20/16 1324    Clinical Impression Statement Patient continues to improve his strength week by week (able to do chest press with no assistance this date). He is able to complete/tolerate increased ROM to roughly 130 degrees this date, reports his arm is not as painful as it was. He continues to have severe weakness of his LUE, will continue to benefit from skilled PT services to address his limitaitons in AROM.    Clinical Presentation Stable   Clinical Decision Making Moderate   Rehab Potential Good   PT Frequency 2x / week   PT Duration 12 weeks   PT Treatment/Interventions Aquatic Therapy;Cryotherapy;Electrical Stimulation;Therapeutic exercise;Therapeutic activities;Balance training;Taping;Dry needling;Neuromuscular re-education;Patient/family education;Passive range of motion;Ultrasound;Iontophoresis 4mg /ml Dexamethasone   PT Next Visit Plan Progress per protocol    PT Home Exercise Plan Passive ER and passive overhead flexion.    Consulted and Agree with Plan of Care Patient      Patient will benefit from skilled therapeutic intervention in order to improve the following deficits and impairments:  Pain, Abnormal gait, Improper body mechanics, Decreased mobility, Impaired UE functional use, Obesity, Difficulty walking, Decreased knowledge of precautions, Decreased balance, Decreased activity tolerance, Decreased endurance, Decreased range of motion, Decreased strength  Visit Diagnosis: Pain in left arm  Weakness of left arm     Problem List Patient Active Problem List   Diagnosis Date Noted  . Nicotine dependence 10/18/2016  . Vitamin D deficiency 10/18/2016  . Hypertension   . Diabetes mellitus without complication ([place])   . Sleep apnea   . Closed left scapular  fracture 10/15/2016  . Closed displaced segmental fracture of shaft of left humerus 10/15/2016   Royce Macadamia PT, DPT, CSCS    12/20/2016, 1:50 PM  [facility] [address] [place], [address] Phone: [phone]   Fax:  [phone]  Name: AXAVIER PRESSLEY MRN: [id number] Date of Birth: 01-Oct-1975

## 2016-12-25 ENCOUNTER — Encounter: Payer: Medicaid Other | Admitting: Physical Therapy

## 2016-12-26 ENCOUNTER — Ambulatory Visit: Payer: Medicaid Other | Admitting: Physical Therapy

## 2016-12-26 DIAGNOSIS — M79602 Pain in left arm: Secondary | ICD-10-CM | POA: Diagnosis not present

## 2016-12-26 DIAGNOSIS — R29898 Other symptoms and signs involving the musculoskeletal system: Secondary | ICD-10-CM

## 2016-12-26 NOTE — Therapy (Signed)
[facility] [address] [place], [address] Phone: [phone]   Fax:  [phone]  Physical Therapy Treatment  Patient Details  Name: Kevin Garrett MRN: [id number] Date of Birth: 08/08/1975 Referring Provider: Dr. Marcelino Scot  Encounter Date: 12/26/2016      PT End of Session - 12/26/16 1356    Visit Number 6   Number of Visits 25   Date for PT Re-Evaluation 03/13/17   PT Start Time 1340   PT Stop Time 1422   PT Time Calculation (min) 42 min   Activity Tolerance Patient tolerated treatment well;Patient limited by pain   Behavior During Therapy [facility] for tasks assessed/performed      Past Medical History:  Diagnosis Date  . Anxiety   . Diabetes mellitus without complication ([place])   . Hypertension   . Nicotine dependence 10/18/2016  . Sleep apnea   . Vitamin D deficiency 10/18/2016    Past Surgical History:  Procedure Laterality Date  . ORIF HUMERUS FRACTURE Left 10/17/2016   Procedure: OPEN REDUCTION INTERNAL FIXATION (ORIF) LEFT DISTAL HUMERUS FRACTURE;  Surgeon: Altamese [place], MD;  Location: [facility];  Service: Orthopedics;  Laterality: Left;    There were no vitals filed for this visit.      Subjective Assessment - 12/26/16 1342    Subjective Patient reports he is doing well and able to do some simple ADLs with his LUE. He still gets some pain in his medial L forearm, reports he is not getting the numbness/tingling in his hand still.    Limitations House hold activities;Lifting   Diagnostic tests Extensive fractures in LUE, repaired via ORIF, no full union seen yet.    Patient Stated Goals To regain functional use of his LUE    Currently in Pain? Other (Comment)  At rest his pain levels are much less than they were, he still gets pain around the elbow and medial forearm with overhead lifting, occasional movements.    Pain Location Elbow   Pain Orientation Left   Pain Descriptors / Indicators Aching    Pain Type Chronic pain;Surgical pain   Pain Onset More than a month ago   Pain Frequency Intermittent      AROM - 60 degrees of forward elevation before it became painful/weak for him.   High volt stimulation to infraspinatus and posterior deltoid (175V and 155V respectively) on continuous   Chest press with min-mod A through pain free ROM (still quite weak) x 15 for 3 sets  Chest Press with PVC pipe x 15 , with 0.25# weight added x 15, 1# weight added x 12 with very min A from therapist   PROM into flexion/overhead motion with PT assistance to roughly 135-150 degrees -- well tolerated, improved motion from previous session   AAROM  From 70-120 degrees of flexion in supine x 20 repetitions  Long lever PVC from 0-30 degrees AAROM in supine x 10 for 3 sets   PROM into ER stretching up to 83 degrees this date                            PT Education - 12/26/16 1355    Education provided Yes   Education Details will provide HEP at follow up session.    Person(s) Educated Patient   Methods Explanation;Demonstration   Comprehension Verbalized understanding;Returned demonstration          PT Short Term Goals -  11/27/16 0801      PT SHORT TERM GOAL #1   Title Patient will wear sling and not use LUE for any activities to maintain precautionary activity modifications.    Baseline Patient has been wearing sling but using LUE for minimal activities.    Time 4   Period Weeks   Status New           PT Long Term Goals - 11/27/16 0800      PT LONG TERM GOAL #1   Title Patient will demonstrate at least 150 degrees of active shoulder flexion to complete ADLs.    Baseline Not able to demonstrate at this time due to surgical precautions.    Time 16   Period Weeks   Status New     PT LONG TERM GOAL #2   Title Patient will be able to carry at least 10# in his LUE to complete work related functions.    Baseline Not able to demonstrate at this time due to  surgical precautions.    Time 16   Period Weeks   Status New     PT LONG TERM GOAL #3   Title Patient will report worst pain of less than 3/10 to demonstrate improved tolerance for ADLs.    Baseline Patient reports 7-8/10 at worst.    Time 16   Period Weeks   Status New     PT LONG TERM GOAL #4   Title Patient will be able to lift items <5# overhead with no increase in pain to complete ADLs.    Baseline Not able to demonstrate at this time due to surgical precautions.    Time 16   Period Weeks   Status New               Plan - 12/26/16 1432    Clinical Impression Statement Patient continues to gradually increase how much AROM he is able to perform in sitting, his tolerance for PROM continues to increase in both ER and flexion. He is still quite weak in all glenohumeral musculature, though this is anticipated for some time given the extent of his injuries. Will continue to progress passive flexion and strengthening to allow for more functional utilization of his LUE.    Clinical Presentation Stable   Clinical Decision Making Moderate   Rehab Potential Good   PT Frequency 2x / week   PT Duration 12 weeks   PT Treatment/Interventions Aquatic Therapy;Cryotherapy;Electrical Stimulation;Therapeutic exercise;Therapeutic activities;Balance training;Taping;Dry needling;Neuromuscular re-education;Patient/family education;Passive range of motion;Ultrasound;Iontophoresis 4mg /ml Dexamethasone   PT Next Visit Plan Progress per protocol    PT Home Exercise Plan Passive ER and passive overhead flexion.    Consulted and Agree with Plan of Care Patient      Patient will benefit from skilled therapeutic intervention in order to improve the following deficits and impairments:  Pain, Abnormal gait, Improper body mechanics, Decreased mobility, Impaired UE functional use, Obesity, Difficulty walking, Decreased knowledge of precautions, Decreased balance, Decreased activity tolerance, Decreased  endurance, Decreased range of motion, Decreased strength  Visit Diagnosis: Pain in left arm  Weakness of left arm     Problem List Patient Active Problem List   Diagnosis Date Noted  . Nicotine dependence 10/18/2016  . Vitamin D deficiency 10/18/2016  . Hypertension   . Diabetes mellitus without complication ([place])   . Sleep apnea   . Closed left scapular fracture 10/15/2016  . Closed displaced segmental fracture of shaft of left humerus 10/15/2016   Royce Macadamia PT,  DPT, CSCS    12/26/2016, 2:38 PM  [facility] [address] [place], [address] Phone: [phone]   Fax:  [phone]  Name: Kevin Garrett MRN: [id number] Date of Birth: [birth year]

## 2016-12-26 NOTE — Patient Instructions (Addendum)
AROM - 60 degrees of forward elevation before it became painful/weak for him.   High volt stimulation to infraspinatus and posterior deltoid (175V and 155V respectively) on continuous   Chest press with min-mod A through pain free ROM (still quite weak) x 15 for 3 sets  Chest Press with PVC pipe x 15 , with 0.25# weight added x 15, 1# weight added x 12 with very min A from therapist   PROM into flexion/overhead motion with PT assistance to roughly 135-150 degrees -- well tolerated, improved motion from previous session   AAROM  From 70-120 degrees of flexion in supine x 20 repetitions  Long lever PVC from 0-30 degrees AAROM in supine x 10 for 3 sets   PROM into ER stretching up to 83 degrees this date

## 2016-12-27 ENCOUNTER — Encounter: Payer: Medicaid Other | Admitting: Physical Therapy

## 2016-12-28 ENCOUNTER — Ambulatory Visit: Payer: Medicaid Other | Admitting: Physical Therapy

## 2016-12-28 DIAGNOSIS — M79602 Pain in left arm: Secondary | ICD-10-CM | POA: Diagnosis not present

## 2016-12-28 DIAGNOSIS — R29898 Other symptoms and signs involving the musculoskeletal system: Secondary | ICD-10-CM

## 2016-12-28 NOTE — Therapy (Signed)
[facility] [address], [place], [address] Phone: [phone]   Fax:  [phone]  Physical Therapy Treatment  Patient Details  Name: Kevin Garrett MRN: [id number] Date of Birth: [birth year] Referring Provider: Dr. Marcelino Scot  Encounter Date: 12/28/2016      PT End of Session - 12/28/16 1645    Visit Number 7   Number of Visits 25   Date for PT Re-Evaluation 03/13/17   PT Start Time 3295   PT Stop Time 1605   PT Time Calculation (min) 49 min   Activity Tolerance Patient tolerated treatment well;Patient limited by pain   Behavior During Therapy [facility] for tasks assessed/performed      Past Medical History:  Diagnosis Date  . Anxiety   . Diabetes mellitus without complication ([place])   . Hypertension   . Nicotine dependence 10/18/2016  . Sleep apnea   . Vitamin D deficiency 10/18/2016    Past Surgical History:  Procedure Laterality Date  . ORIF HUMERUS FRACTURE Left 10/17/2016   Procedure: OPEN REDUCTION INTERNAL FIXATION (ORIF) LEFT DISTAL HUMERUS FRACTURE;  Surgeon: Altamese Clarkton, MD;  Location: [facility];  Service: Orthopedics;  Laterality: Left;    There were no vitals filed for this visit.      Subjective Assessment - 12/28/16 1637    Subjective Pt reports he is worried because he does not have the strength to lift his L UE upwards against gravity.   Diagnostic tests Extensive fractures in LUE, repaired via ORIF, no full union seen yet.    Patient Stated Goals To regain functional use of his LUE    Currently in Pain? Yes   Pain Location Elbow   Pain Orientation Left   Pain Descriptors / Indicators Aching   Pain Type Chronic pain;Surgical pain   Pain Onset More than a month ago   Pain Frequency Intermittent                         [facility] Adult PT Treatment/Exercise - 12/28/16 0001      Exercises   Exercises Shoulder     Shoulder Exercises: Supine   Flexion --  chest press in  supine with PVC pipe with min-mod A 3x8     Shoulder Exercises: Standing   Other Standing Exercises L shoulder isometric flex, exten, and abd in standing with pillow 2x10 - added to HEP     Modalities   Modalities Electrical Stimulation  IFC to L shoulder in sitting x 15 min A/P     Manual Therapy   Manual therapy comments Manual AAROM and PROM in all planes in supine to L shoulder                PT Education - 12/28/16 1644    Education provided Yes   Education Details Added shoulder isometrics in standing with pillow (flex, abd, extension) to HEP and gave pt hand outs with written instructions and photos of the ex's.   Person(s) Educated Patient   Methods Demonstration;Handout   Comprehension Returned demonstration          PT Short Term Goals - 12/28/16 1648      PT SHORT TERM GOAL #1   Title Patient will wear sling and not use LUE for any activities to maintain precautionary activity modifications.    Baseline Patient has been wearing sling but using LUE for minimal activities.    Time 4  Period Weeks   Status New           PT Long Term Goals - 12/28/16 1648      PT LONG TERM GOAL #1   Title Patient will demonstrate at least 150 degrees of active shoulder flexion to complete ADLs.    Baseline Not able to demonstrate at this time due to surgical precautions.    Time 16   Period Weeks   Status New   Target Date 03/13/17     PT LONG TERM GOAL #2   Title Patient will be able to carry at least 10# in his LUE to complete work related functions.    Baseline Not able to demonstrate at this time due to surgical precautions.    Time 16   Period Weeks   Status New   Target Date 03/13/17     PT LONG TERM GOAL #4   Title Patient will be able to lift items <5# overhead with no increase in pain to complete ADLs.    Baseline Not able to demonstrate at this time due to surgical precautions.    Time 16   Period Weeks   Status New   Target Date 03/13/17                Plan - 12/28/16 1646    Clinical Impression Statement Pt limited by weakness and pain in L UE.  Added isometric strengthening ex's in gym and to HEP today without problems.  Continue ROM and strengthening as tolerated.   Clinical Presentation Stable   Clinical Decision Making Moderate   Rehab Potential Good   PT Frequency 2x / week   PT Duration 12 weeks   PT Treatment/Interventions Aquatic Therapy;Cryotherapy;Electrical Stimulation;Therapeutic exercise;Therapeutic activities;Balance training;Taping;Dry needling;Neuromuscular re-education;Patient/family education;Passive range of motion;Ultrasound;Iontophoresis 4mg /ml Dexamethasone   PT Next Visit Plan Progress per protocol    PT Home Exercise Plan Passive ER and passive overhead flexion. shoulder isometrics.   Consulted and Agree with Plan of Care Patient      Patient will benefit from skilled therapeutic intervention in order to improve the following deficits and impairments:  Pain, Abnormal gait, Improper body mechanics, Decreased mobility, Impaired UE functional use, Obesity, Difficulty walking, Decreased knowledge of precautions, Decreased balance, Decreased activity tolerance, Decreased endurance, Decreased range of motion, Decreased strength  Visit Diagnosis: Pain in left arm  Weakness of left arm     Problem List Patient Active Problem List   Diagnosis Date Noted  . Nicotine dependence 10/18/2016  . Vitamin D deficiency 10/18/2016  . Hypertension   . Diabetes mellitus without complication ([place])   . Sleep apnea   . Closed left scapular fracture 10/15/2016  . Closed displaced segmental fracture of shaft of left humerus 10/15/2016    Zellie Jenning,MPT 12/28/2016, 4:54 PM  [place] [facility] [address], [place], [address] Phone: [phone]   Fax:  [phone]  Name: Kevin Garrett MRN: [id number] Date of Birth:  Sep 04, 1975

## 2017-01-01 ENCOUNTER — Ambulatory Visit: Payer: Medicaid Other | Admitting: Physical Therapy

## 2017-01-01 ENCOUNTER — Encounter: Payer: Self-pay | Admitting: Physical Therapy

## 2017-01-01 ENCOUNTER — Encounter: Payer: Medicaid Other | Admitting: Physical Therapy

## 2017-01-01 DIAGNOSIS — M79602 Pain in left arm: Secondary | ICD-10-CM

## 2017-01-01 DIAGNOSIS — R29898 Other symptoms and signs involving the musculoskeletal system: Secondary | ICD-10-CM

## 2017-01-01 NOTE — Therapy (Signed)
[place] [facility] [address], [place], [address] Phone: [phone]   Fax:  [phone]  Physical Therapy Treatment  Patient Details  Name: Kevin Garrett MRN: [id number] Date of Birth: Apr 18, 1975 Referring Provider: Dr. Marcelino Scot  Encounter Date: 01/01/2017      PT End of Session - 01/01/17 1032    Visit Number 8   Number of Visits 25   Date for PT Re-Evaluation 03/13/17   PT Start Time 1031   PT Stop Time 1112   PT Time Calculation (min) 41 min   Activity Tolerance Patient tolerated treatment well;Patient limited by pain   Behavior During Therapy [facility] for tasks assessed/performed      Past Medical History:  Diagnosis Date  . Anxiety   . Diabetes mellitus without complication ([place])   . Hypertension   . Nicotine dependence 10/18/2016  . Sleep apnea   . Vitamin D deficiency 10/18/2016    Past Surgical History:  Procedure Laterality Date  . ORIF HUMERUS FRACTURE Left 10/17/2016   Procedure: OPEN REDUCTION INTERNAL FIXATION (ORIF) LEFT DISTAL HUMERUS FRACTURE;  Surgeon: Altamese Mooringsport, MD;  Location: [facility];  Service: Orthopedics;  Laterality: Left;    There were no vitals filed for this visit.      Subjective Assessment - 01/01/17 1034    Subjective Pt reports his R forearm is experiencing less pain now.  No new complaints or concerns.  Pt reports he has been completing his HEP but says he has been completing his isometric exercises in supine on couch rather than at the doorway.    Diagnostic tests Extensive fractures in LUE, repaired via ORIF, no full union seen yet.    Patient Stated Goals To regain functional use of his LUE    Currently in Pain? Yes   Pain Score 8    Pain Location Arm   Pain Orientation Left   Pain Descriptors / Indicators Aching   Pain Onset More than a month ago   Pain Frequency Constant   Multiple Pain Sites No      TREATMENT   Therapeutic Exercise:   Chest press in supine  with cane with minA 3x10   L elbow flexion in supine with 1# dumbell. 2x10. Pt reports this as medium difficulty.   L shoulder isometric flex, exten, and abd in standing with pillow 2x10 with 10 second holds - emphasized correct technique to be performed as part of HEP     Manual Therapy:   PROM x10 in all planes in supine to L shoulder   AAROM x10 in all planes in supine to L shoulder             PT Education - 01/01/17 1032    Education provided Yes   Education Details Exercise technique; reasoning behind interventions; emphasized to pt the importance of completing HEP as prescribed   Person(s) Educated Patient   Methods Explanation;Demonstration;Verbal cues   Comprehension Verbalized understanding;Returned demonstration;Verbal cues required;Need further instruction          PT Short Term Goals - 12/28/16 1648      PT SHORT TERM GOAL #1   Title Patient will wear sling and not use LUE for any activities to maintain precautionary activity modifications.    Baseline Patient has been wearing sling but using LUE for minimal activities.    Time 4   Period Weeks   Status New           PT  Long Term Goals - 12/28/16 1648      PT LONG TERM GOAL #1   Title Patient will demonstrate at least 150 degrees of active shoulder flexion to complete ADLs.    Baseline Not able to demonstrate at this time due to surgical precautions.    Time 16   Period Weeks   Status New   Target Date 03/13/17     PT LONG TERM GOAL #2   Title Patient will be able to carry at least 10# in his LUE to complete work related functions.    Baseline Not able to demonstrate at this time due to surgical precautions.    Time 16   Period Weeks   Status New   Target Date 03/13/17     PT LONG TERM GOAL #4   Title Patient will be able to lift items <5# overhead with no increase in pain to complete ADLs.    Baseline Not able to demonstrate at this time due to surgical precautions.    Time 16    Period Weeks   Status New   Target Date 03/13/17               Plan - 01/01/17 1108    Clinical Impression Statement Pt reported an improvement in L forearm pain since starting therapy.  Provided pt with max encouragement throughout the session as pt reports pain with any LUE mobility.  Provided education to pt on the importance of completing HEP as prescribed for better outcomes and safety.  Pt verbalized understanding.  Reviewed isometric exercises as he had been completing incorrectly.  Introduced L elbow flexion strengthening exercise which the pt tolerated well and reported as challenging.  The pt will benefit from continued PT services for improved strength and functional use of LUE.     Rehab Potential Good   PT Frequency 2x / week   PT Duration 12 weeks   PT Treatment/Interventions Aquatic Therapy;Cryotherapy;Electrical Stimulation;Therapeutic exercise;Therapeutic activities;Balance training;Taping;Dry needling;Neuromuscular re-education;Patient/family education;Passive range of motion;Ultrasound;Iontophoresis 4mg /ml Dexamethasone   PT Next Visit Plan Progress per protocol    PT Home Exercise Plan Passive ER and passive overhead flexion. shoulder isometrics.   Consulted and Agree with Plan of Care Patient      Patient will benefit from skilled therapeutic intervention in order to improve the following deficits and impairments:  Pain, Abnormal gait, Improper body mechanics, Decreased mobility, Impaired UE functional use, Obesity, Difficulty walking, Decreased knowledge of precautions, Decreased balance, Decreased activity tolerance, Decreased endurance, Decreased range of motion, Decreased strength  Visit Diagnosis: Pain in left arm  Weakness of left arm     Problem List Patient Active Problem List   Diagnosis Date Noted  . Nicotine dependence 10/18/2016  . Vitamin D deficiency 10/18/2016  . Hypertension   . Diabetes mellitus without complication ([place])   . Sleep apnea    . Closed left scapular fracture 10/15/2016  . Closed displaced segmental fracture of shaft of left humerus 10/15/2016    Collie Siad PT, DPT 01/01/2017, 11:14 AM  [place] [facility] [address] [place], [address] Phone: [phone]   Fax:  [phone]  Name: Kevin Garrett MRN: [id number] Date of Birth: 15-Apr-1975

## 2017-01-03 ENCOUNTER — Encounter: Payer: Medicaid Other | Admitting: Physical Therapy

## 2017-01-04 ENCOUNTER — Ambulatory Visit: Payer: Medicaid Other | Admitting: Physical Therapy

## 2017-01-04 DIAGNOSIS — R29898 Other symptoms and signs involving the musculoskeletal system: Secondary | ICD-10-CM

## 2017-01-04 DIAGNOSIS — M79602 Pain in left arm: Secondary | ICD-10-CM

## 2017-01-04 NOTE — Therapy (Signed)
[facility] [address] [place], [address] Phone: [phone]   Fax:  [phone]  Physical Therapy Treatment  Patient Details  Name: Kevin Garrett MRN: [id number] Date of Birth: April 03, 1975 Referring Provider: Dr. Marcelino Scot  Encounter Date: 01/04/2017      PT End of Session - 01/04/17 1031    Visit Number 9   Number of Visits 25   Date for PT Re-Evaluation 03/13/17   PT Start Time 0947   PT Stop Time 1035   PT Time Calculation (min) 48 min   Activity Tolerance Patient tolerated treatment well;Patient limited by pain   Behavior During Therapy [facility] for tasks assessed/performed      Past Medical History:  Diagnosis Date  . Anxiety   . Diabetes mellitus without complication ([place])   . Hypertension   . Nicotine dependence 10/18/2016  . Sleep apnea   . Vitamin D deficiency 10/18/2016    Past Surgical History:  Procedure Laterality Date  . ORIF HUMERUS FRACTURE Left 10/17/2016   Procedure: OPEN REDUCTION INTERNAL FIXATION (ORIF) LEFT DISTAL HUMERUS FRACTURE;  Surgeon: Altamese [place], MD;  Location: [facility];  Service: Orthopedics;  Laterality: Left;    There were no vitals filed for this visit.      Subjective Assessment - 01/04/17 1026    Subjective Pt reports 7/10 pain today in L shoulder.  "I'm worried I'm never going to get strength back in my arm."   Limitations House hold activities;Lifting   Diagnostic tests Extensive fractures in LUE, repaired via ORIF, no full union seen yet.    Patient Stated Goals To regain functional use of his LUE    Pain Score 7    Pain Location Shoulder   Pain Orientation Left   Pain Descriptors / Indicators Aching   Pain Onset More than a month ago   Pain Frequency Constant                         [facility] Adult PT Treatment/Exercise - 01/04/17 0001      Shoulder Exercises: Supine   Other Supine Exercises chest press with PVC with assistance from PT 3x5      Shoulder Exercises: Standing   Other Standing Exercises L shoulder isometrics in stdg with pillow, flex, abd, extension 10x each     Electrical Stimulation   Electrical Stimulation Location --  IFC to L shoulder with CP at end of session in sitting      Manual Therapy   Manual therapy comments Manual PROM and AAROM in all planes to L shoulder                PT Education - 01/04/17 1030    Education provided Yes   Education Details Reviewed with pt importance of not "overdoing it" with exercises.  Discussed gentle isometrics-not forceful.   Person(s) Educated Patient   Methods Explanation;Demonstration   Comprehension Verbalized understanding;Returned demonstration          PT Short Term Goals - 12/28/16 1648      PT SHORT TERM GOAL #1   Title Patient will wear sling and not use LUE for any activities to maintain precautionary activity modifications.    Baseline Patient has been wearing sling but using LUE for minimal activities.    Time 4   Period Weeks   Status New           PT Long Term Goals -  12/28/16 1648      PT LONG TERM GOAL #1   Title Patient will demonstrate at least 150 degrees of active shoulder flexion to complete ADLs.    Baseline Not able to demonstrate at this time due to surgical precautions.    Time 16   Period Weeks   Status New   Target Date 03/13/17     PT LONG TERM GOAL #2   Title Patient will be able to carry at least 10# in his LUE to complete work related functions.    Baseline Not able to demonstrate at this time due to surgical precautions.    Time 16   Period Weeks   Status New   Target Date 03/13/17     PT LONG TERM GOAL #4   Title Patient will be able to lift items <5# overhead with no increase in pain to complete ADLs.    Baseline Not able to demonstrate at this time due to surgical precautions.    Time 16   Period Weeks   Status New   Target Date 03/13/17               Plan - 01/04/17 1032     Clinical Impression Statement Pt responded well to AAROM/PROM of L shoulder today.  He is making ROM gains in all planes over the last week with less pain and muscle guarding.  Continue to work on ROM at next visit.   Clinical Presentation Stable   Clinical Decision Making Moderate   Rehab Potential Good   PT Frequency 2x / week   PT Duration 12 weeks   PT Treatment/Interventions Aquatic Therapy;Cryotherapy;Electrical Stimulation;Therapeutic exercise;Therapeutic activities;Balance training;Taping;Dry needling;Neuromuscular re-education;Patient/family education;Passive range of motion;Ultrasound;Iontophoresis 4mg /ml Dexamethasone   PT Next Visit Plan Progress per protocol    Consulted and Agree with Plan of Care Patient      Patient will benefit from skilled therapeutic intervention in order to improve the following deficits and impairments:  Pain, Abnormal gait, Improper body mechanics, Decreased mobility, Impaired UE functional use, Obesity, Difficulty walking, Decreased knowledge of precautions, Decreased balance, Decreased activity tolerance, Decreased endurance, Decreased range of motion, Decreased strength  Visit Diagnosis: Pain in left arm  Weakness of left arm     Problem List Patient Active Problem List   Diagnosis Date Noted  . Nicotine dependence 10/18/2016  . Vitamin D deficiency 10/18/2016  . Hypertension   . Diabetes mellitus without complication ([place])   . Sleep apnea   . Closed left scapular fracture 10/15/2016  . Closed displaced segmental fracture of shaft of left humerus 10/15/2016    Marquise Lambson, MPT 01/04/2017, 10:43 AM  [facility] [address] [place], [address] Phone: [phone]   Fax:  [phone]  Name: TAYLON COOLE MRN: [id number] Date of Birth: July 02, 1975

## 2017-01-08 ENCOUNTER — Ambulatory Visit: Payer: Medicaid Other | Admitting: Physical Therapy

## 2017-01-08 ENCOUNTER — Encounter: Payer: Medicaid Other | Admitting: Physical Therapy

## 2017-01-08 DIAGNOSIS — R29898 Other symptoms and signs involving the musculoskeletal system: Secondary | ICD-10-CM

## 2017-01-08 DIAGNOSIS — M79602 Pain in left arm: Secondary | ICD-10-CM | POA: Diagnosis not present

## 2017-01-08 NOTE — Therapy (Signed)
[facility] [address] [place], [address] Phone: [phone]   Fax:  [phone]  Physical Therapy Treatment  Patient Details  Name: Kevin Garrett MRN: [id number] Date of Birth: November 01, 1975 Referring Provider: Dr. Marcelino Scot  Encounter Date: 01/08/2017      PT End of Session - 01/08/17 1443    Visit Number 10   Number of Visits 25   Date for PT Re-Evaluation 03/13/17   PT Start Time 3664   PT Stop Time 1518   PT Time Calculation (min) 42 min   Activity Tolerance Patient tolerated treatment well;Patient limited by pain   Behavior During Therapy [facility] for tasks assessed/performed      Past Medical History:  Diagnosis Date  . Anxiety   . Diabetes mellitus without complication ([place])   . Hypertension   . Nicotine dependence 10/18/2016  . Sleep apnea   . Vitamin D deficiency 10/18/2016    Past Surgical History:  Procedure Laterality Date  . ORIF HUMERUS FRACTURE Left 10/17/2016   Procedure: OPEN REDUCTION INTERNAL FIXATION (ORIF) LEFT DISTAL HUMERUS FRACTURE;  Surgeon: Altamese Oketo, MD;  Location: [facility];  Service: Orthopedics;  Laterality: Left;    There were no vitals filed for this visit.      Subjective Assessment - 01/08/17 1437    Subjective Patient reports he continues to feel stronger with his LUE, reports the pain is roughly the same. He has been performing his HEP.    Limitations House hold activities;Lifting   Diagnostic tests Extensive fractures in LUE, repaired via ORIF, no full union seen yet.    Patient Stated Goals To regain functional use of his LUE    Currently in Pain? Yes   Pain Score 7    Pain Location Arm   Pain Orientation Left   Pain Descriptors / Indicators Aching   Pain Type Chronic pain;Surgical pain   Pain Onset More than a month ago   Pain Frequency Constant      AROM to 76 degrees in sitting   AAROM x 10 through roughly 90-110 degrees with elbows extended (went  further on 1 occasion and had significant increase in biceps muscle belly, thus educated to reduce his ROM)   PROM into ER to 75 degrees (well tolerated) at 90 degrees of abduction with mild stretching provided -- multiple bouts of stretching (gently) performed.   Isometric ER in neutral x 10 repetitions for 3-5" holds for 3 sets in scaption plane in supine   Sidelying ER with [place] Stim with PT providing min A (Russian stim applied to ER infraspinatus and posterior deltoid region) to roughly 5mA per patient stating appropriate level.   Long lever arm arcs in supine to tolerated end range (roughly 130 degrees of forward elevation) with stim pads to High volt for pain control to 175V on infraspinatus and 95V on posterior deltoid per patient reporting appropriate level. X 20 repetitions x 3 sets -- well tolerated.                            PT Education - 01/08/17 1622    Education provided Yes   Education Details Will provide strengthening program in next session for HEP.    Person(s) Educated Patient   Methods Explanation;Demonstration   Comprehension Verbalized understanding;Returned demonstration          PT Short Term Goals - 12/28/16 1648  PT SHORT TERM GOAL #1   Title Patient will wear sling and not use LUE for any activities to maintain precautionary activity modifications.    Baseline Patient has been wearing sling but using LUE for minimal activities.    Time 4   Period Weeks   Status New           PT Long Term Goals - 12/28/16 1648      PT LONG TERM GOAL #1   Title Patient will demonstrate at least 150 degrees of active shoulder flexion to complete ADLs.    Baseline Not able to demonstrate at this time due to surgical precautions.    Time 16   Period Weeks   Status New   Target Date 03/13/17     PT LONG TERM GOAL #2   Title Patient will be able to carry at least 10# in his LUE to complete work related functions.    Baseline Not  able to demonstrate at this time due to surgical precautions.    Time 16   Period Weeks   Status New   Target Date 03/13/17     PT LONG TERM GOAL #4   Title Patient will be able to lift items <5# overhead with no increase in pain to complete ADLs.    Baseline Not able to demonstrate at this time due to surgical precautions.    Time 16   Period Weeks   Status New   Target Date 03/13/17               Plan - 01/08/17 1444    Clinical Impression Statement Patient continues to slowly make strength progress, he is now able to perform a chest press without assistance. His AROM has improved from 60 to 76 degrees, though he is quite limited with ER strength (to be expected). Will begin strengthening his ER musculature, he is tolerating ROM in supine much better both AAROM and PROM.    Clinical Presentation Stable   Clinical Decision Making Moderate   Rehab Potential Good   PT Frequency 2x / week   PT Duration 12 weeks   PT Treatment/Interventions Aquatic Therapy;Cryotherapy;Electrical Stimulation;Therapeutic exercise;Therapeutic activities;Balance training;Taping;Dry needling;Neuromuscular re-education;Patient/family education;Passive range of motion;Ultrasound;Iontophoresis 4mg /ml Dexamethasone   PT Next Visit Plan Progress per protocol    Consulted and Agree with Plan of Care Patient      Patient will benefit from skilled therapeutic intervention in order to improve the following deficits and impairments:  Pain, Abnormal gait, Improper body mechanics, Decreased mobility, Impaired UE functional use, Obesity, Difficulty walking, Decreased knowledge of precautions, Decreased balance, Decreased activity tolerance, Decreased endurance, Decreased range of motion, Decreased strength  Visit Diagnosis: Pain in left arm  Weakness of left arm     Problem List Patient Active Problem List   Diagnosis Date Noted  . Nicotine dependence 10/18/2016  . Vitamin D deficiency 10/18/2016  .  Hypertension   . Diabetes mellitus without complication ([place])   . Sleep apnea   . Closed left scapular fracture 10/15/2016  . Closed displaced segmental fracture of shaft of left humerus 10/15/2016   Royce Macadamia PT, DPT, CSCS    01/08/2017, 4:24 PM  [facility] [address], [place], [address] Phone: [phone]   Fax:  [phone]  Name: Kevin Garrett MRN: [id number] Date of Birth: April 29, 1975

## 2017-01-08 NOTE — Patient Instructions (Addendum)
AROM to 76 degrees in sitting   AAROM x 10 through roughly 90-110 degrees with elbows extended (went further on 1 occasion and had significant increase in biceps muscle belly, thus educated to reduce his ROM)   PROM into ER to 75 degrees (well tolerated) at 90 degrees of abduction with mild stretching provided  Isometric ER in neutral  Sidelying ER with [place] Stim   Long lever arm arcs in supine to tolerated end range (roughly 130 degrees of forward elevation)

## 2017-01-10 ENCOUNTER — Encounter: Payer: Medicaid Other | Admitting: Physical Therapy

## 2017-01-11 ENCOUNTER — Ambulatory Visit: Payer: Medicaid Other | Admitting: Physical Therapy

## 2017-01-16 ENCOUNTER — Ambulatory Visit: Payer: Medicaid Other | Admitting: Physical Therapy

## 2017-01-18 ENCOUNTER — Ambulatory Visit: Payer: Medicaid Other | Attending: Orthopedic Surgery | Admitting: Physical Therapy

## 2017-01-18 DIAGNOSIS — M79602 Pain in left arm: Secondary | ICD-10-CM

## 2017-01-18 DIAGNOSIS — R29898 Other symptoms and signs involving the musculoskeletal system: Secondary | ICD-10-CM

## 2017-01-18 NOTE — Patient Instructions (Addendum)
Seated rows on OMEGA with 5# x 15, 10# x 15, 10#x6 followed by 5# x 8  AROM to 145 degrees on LUE   Sidelying ER with 1# weight x 15 for 3 sets   Seated rows with 1# DB x 15 for 3 sets   Seated bent over horizontal abduction with min A x 12 for 3 sets   Standing ER with yellow for 3 sets x 15 repetitions

## 2017-01-18 NOTE — Therapy (Signed)
[facility] [address], [place], [address] Phone: [phone]   Fax:  [phone]  Physical Therapy Treatment  Patient Details  Name: Kevin Garrett MRN: [id number] Date of Birth: February 15, 1976 Referring Provider: Dr. Marcelino Scot   Encounter Date: 01/18/2017  PT End of Session - 01/18/17 1146    Visit Number  11    Number of Visits  25    Date for PT Re-Evaluation  03/13/17    PT Start Time  0900    PT Stop Time  0945    PT Time Calculation (min)  45 min    Activity Tolerance  Patient tolerated treatment well    Behavior During Therapy  [facility] for tasks assessed/performed       Past Medical History:  Diagnosis Date  . Anxiety   . Diabetes mellitus without complication ([place])   . Hypertension   . Nicotine dependence 10/18/2016  . Sleep apnea   . Vitamin D deficiency 10/18/2016    No past surgical history on file.  There were no vitals filed for this visit.  Subjective Assessment - 01/18/17 0910    Subjective  Patient reports he has been working diligently on his ROM and he has noticed significant increase in ROM and decrease in pain, he still has trouble with horizontal abduction.     Limitations  House hold activities;Lifting    Diagnostic tests  Extensive fractures in LUE, repaired via ORIF, no full union seen yet.     Patient Stated Goals  To regain functional use of his LUE     Currently in Pain?  Other (Comment)    Pain Onset  More than a month ago         Seated rows on OMEGA with 5# x 15, 10# x 15, 10#x6 followed by 5# x 8  AROM to 145 degrees on LUE   Sidelying ER with 1# weight x 15 for 3 sets   Seated rows with 1# DB x 15 for 3 sets   Seated bent over horizontal abduction with min A x 12 for 3 sets   Standing ER with yellow for 3 sets x 15 repetitions   Provided tactile and verbal cuing for all exercises to complete with appropriate muscular activity and technique                        PT Education - 01/18/17 1145    Education provided  Yes    Education Details  Provided strengthening program as part of HEP.     Person(s) Educated  Patient    Methods  Explanation;Demonstration;Handout;Verbal cues    Comprehension  Returned demonstration;Verbalized understanding       PT Short Term Goals - 12/28/16 1648      PT SHORT TERM GOAL #1   Title  Patient will wear sling and not use LUE for any activities to maintain precautionary activity modifications.     Baseline  Patient has been wearing sling but using LUE for minimal activities.     Time  4    Period  Weeks    Status  New        PT Long Term Goals - 12/28/16 1648      PT LONG TERM GOAL #1   Title  Patient will demonstrate at least 150 degrees of active shoulder flexion to complete ADLs.     Baseline  Not able to demonstrate at this  time due to surgical precautions.     Time  16    Period  Weeks    Status  New    Target Date  03/13/17      PT LONG TERM GOAL #2   Title  Patient will be able to carry at least 10# in his LUE to complete work related functions.     Baseline  Not able to demonstrate at this time due to surgical precautions.     Time  16    Period  Weeks    Status  New    Target Date  03/13/17      PT LONG TERM GOAL #4   Title  Patient will be able to lift items <5# overhead with no increase in pain to complete ADLs.     Baseline  Not able to demonstrate at this time due to surgical precautions.     Time  16    Period  Weeks    Status  New    Target Date  03/13/17            Plan - 01/18/17 1146    Clinical Impression Statement  Patient has made excellent progress with AROM (up to 145 degrees) and is now more appropriate to begin peri-scapular and rotator cuff strengthening. He continues to have some limited pain with activities, but is tolerating more strenuous activities in clinic much better than in previous sessions.     Rehab Potential   Good    PT Frequency  2x / week    PT Duration  12 weeks    PT Treatment/Interventions  Aquatic Therapy;Cryotherapy;Electrical Stimulation;Therapeutic exercise;Therapeutic activities;Balance training;Taping;Dry needling;Neuromuscular re-education;Patient/family education;Passive range of motion;Ultrasound;Iontophoresis 4mg /ml Dexamethasone    PT Next Visit Plan  Progress per protocol     Consulted and Agree with Plan of Care  Patient       Patient will benefit from skilled therapeutic intervention in order to improve the following deficits and impairments:  Pain, Abnormal gait, Improper body mechanics, Decreased mobility, Impaired UE functional use, Obesity, Difficulty walking, Decreased knowledge of precautions, Decreased balance, Decreased activity tolerance, Decreased endurance, Decreased range of motion, Decreased strength  Visit Diagnosis: Pain in left arm  Weakness of left arm     Problem List Patient Active Problem List   Diagnosis Date Noted  . Nicotine dependence 10/18/2016  . Vitamin D deficiency 10/18/2016  . Hypertension   . Diabetes mellitus without complication ([place])   . Sleep apnea   . Closed left scapular fracture 10/15/2016  . Closed displaced segmental fracture of shaft of left humerus 10/15/2016    Royce Macadamia PT, DPT, CSCS    01/18/2017, 11:49 AM  [facility] [address], [place], [address] Phone: [phone]   Fax:  [phone]  Name: STEEL KERNEY MRN: [id number] Date of Birth: 1975/09/25

## 2017-01-22 ENCOUNTER — Ambulatory Visit: Payer: Medicaid Other | Admitting: Physical Therapy

## 2017-01-22 DIAGNOSIS — R29898 Other symptoms and signs involving the musculoskeletal system: Secondary | ICD-10-CM

## 2017-01-22 DIAGNOSIS — M79602 Pain in left arm: Secondary | ICD-10-CM

## 2017-01-22 NOTE — Patient Instructions (Addendum)
Checked AROM, still roughly where it was last week but much more labored and difficult for him. Reported pain was in anterior shoulder into his biceps   Soft tissue mobilization performed over pec and long head of biceps area which he reports relieved much of his discomfort.   Chest press -- 2# x 12, 3# x 10, 3# x 7  Seated rows with 10# for bilateral UEs on OMEGA x 12 repetitions for 3 sets   Standing ER with towel roll between arm and ribcage x 12 for 3 sets with yellow t-band   Bicep curls on LUE with 2# weight x 12 for 3 sets   Low rows with red t-band x 10 for 3 sets

## 2017-01-22 NOTE — Therapy (Signed)
[facility] [address] [place], [address] Phone: [phone]   Fax:  [phone]  Physical Therapy Treatment  Patient Details  Name: Kevin Garrett MRN: [id number] Date of Birth: [birth year] Referring Provider: Dr. Marcelino Scot   Encounter Date: 01/22/2017  PT End of Session - 01/22/17 1007    Visit Number  12    Number of Visits  25    Date for PT Re-Evaluation  03/13/17    PT Start Time  0949    PT Stop Time  1030    PT Time Calculation (min)  41 min    Activity Tolerance  Patient tolerated treatment well    Behavior During Therapy  [facility] for tasks assessed/performed       Past Medical History:  Diagnosis Date  . Anxiety   . Diabetes mellitus without complication ([place])   . Hypertension   . Nicotine dependence 10/18/2016  . Sleep apnea   . Vitamin D deficiency 10/18/2016    No past surgical history on file.  There were no vitals filed for this visit.  Subjective Assessment - 01/22/17 1000    Subjective  Patient reports he has had some added discomfort from exercises the other day, he is not noticing weakness just some achiness.     Limitations  House hold activities;Lifting    Diagnostic tests  Extensive fractures in LUE, repaired via ORIF, no full union seen yet.     Patient Stated Goals  To regain functional use of his LUE     Currently in Pain?  Yes    Pain Score  -- Does not rate discomfort just points to the front of his shoulder    Pain Location  Arm    Pain Orientation  Left;Anterior    Pain Descriptors / Indicators  Aching    Pain Type  Chronic pain;Surgical pain    Pain Onset  More than a month ago    Pain Frequency  Constant       Checked AROM, still roughly where it was last week but much more labored and difficult for him. Reported pain was in anterior shoulder into his biceps -- performed to conclude session as well to help manage discomfort he was reporting.   Soft tissue  mobilization performed over pec and long head of biceps area which he reports relieved much of his discomfort.   Chest press -- 2# x 12, 3# x 10, 3# x 7  Seated rows with 10# for bilateral UEs on OMEGA x 12 repetitions for 3 sets   Standing ER with towel roll between arm and ribcage x 12 for 3 sets with yellow t-band   Bicep curls on LUE with 2# weight x 12 for 3 sets   Low rows with red t-band x 10 for 3 sets                        PT Education - 01/22/17 1009    Education provided  Yes    Education Details  Reinforced need to continue strengthening.     Person(s) Educated  Patient    Methods  Explanation;Demonstration    Comprehension  Verbalized understanding;Returned demonstration       PT Short Term Goals - 12/28/16 1648      PT SHORT TERM GOAL #1   Title  Patient will wear sling and not use LUE for any activities to maintain precautionary activity modifications.  Baseline  Patient has been wearing sling but using LUE for minimal activities.     Time  4    Period  Weeks    Status  New        PT Long Term Goals - 12/28/16 1648      PT LONG TERM GOAL #1   Title  Patient will demonstrate at least 150 degrees of active shoulder flexion to complete ADLs.     Baseline  Not able to demonstrate at this time due to surgical precautions.     Time  16    Period  Weeks    Status  New    Target Date  03/13/17      PT LONG TERM GOAL #2   Title  Patient will be able to carry at least 10# in his LUE to complete work related functions.     Baseline  Not able to demonstrate at this time due to surgical precautions.     Time  16    Period  Weeks    Status  New    Target Date  03/13/17      PT LONG TERM GOAL #4   Title  Patient will be able to lift items <5# overhead with no increase in pain to complete ADLs.     Baseline  Not able to demonstrate at this time due to surgical precautions.     Time  16    Period  Weeks    Status  New    Target Date   03/13/17            Plan - 01/22/17 1007    Clinical Impression Statement  Patient continues to progress with tolerance for light resistance progressions, with prolonged bouts of resistance exercises he has regained his ROM, but has deficits with fatigue as he is unable to lift his arm overhead after several exercises due to weakness. While he continues to have UE pain, it is dissipating with continued focus on ROM and strengthening.     Clinical Presentation  Stable    Clinical Decision Making  Moderate    Rehab Potential  Good    PT Frequency  2x / week    PT Duration  12 weeks    PT Treatment/Interventions  Aquatic Therapy;Cryotherapy;Electrical Stimulation;Therapeutic exercise;Therapeutic activities;Balance training;Taping;Dry needling;Neuromuscular re-education;Patient/family education;Passive range of motion;Ultrasound;Iontophoresis 4mg /ml Dexamethasone    PT Next Visit Plan  Progress per protocol     Consulted and Agree with Plan of Care  Patient       Patient will benefit from skilled therapeutic intervention in order to improve the following deficits and impairments:  Pain, Abnormal gait, Improper body mechanics, Decreased mobility, Impaired UE functional use, Obesity, Difficulty walking, Decreased knowledge of precautions, Decreased balance, Decreased activity tolerance, Decreased endurance, Decreased range of motion, Decreased strength  Visit Diagnosis: Pain in left arm  Weakness of left arm     Problem List Patient Active Problem List   Diagnosis Date Noted  . Nicotine dependence 10/18/2016  . Vitamin D deficiency 10/18/2016  . Hypertension   . Diabetes mellitus without complication ([place])   . Sleep apnea   . Closed left scapular fracture 10/15/2016  . Closed displaced segmental fracture of shaft of left humerus 10/15/2016   Royce Macadamia PT, DPT, CSCS    01/22/2017, 2:22 PM   [facility] [address], [place], [address] Phone: [phone]   Fax:  [phone]  Name:  Kevin Garrett MRN: [id number] Date of Birth: 07-25-1975

## 2017-01-24 ENCOUNTER — Ambulatory Visit: Payer: Medicaid Other | Admitting: Physical Therapy

## 2017-01-24 DIAGNOSIS — M79602 Pain in left arm: Secondary | ICD-10-CM | POA: Diagnosis not present

## 2017-01-24 DIAGNOSIS — R29898 Other symptoms and signs involving the musculoskeletal system: Secondary | ICD-10-CM

## 2017-01-24 NOTE — Patient Instructions (Addendum)
Scaption with 1# DB and mod A from therapist x 10 repetitions, x15 repetitions with PT providing min A for 2 sets   3-point rows with 1# DB x 15, 3# x 15 repetitions -- 5# x 12  Bicep curls with 3# DB x 12 for 3 sets (appropriate fatigue noted)   Chest Press - 3# x 8 repetitions for 3 sets   Standing single arm rows on cable with 10# x 10 repetitions and min A from therapist for 3 sets   UE Ranger - x 5 repetitions through available ROM with appropriate technique

## 2017-01-26 NOTE — Therapy (Signed)
[facility] [address] [place], [address] Phone: [phone]   Fax:  [phone]  Physical Therapy Treatment  Patient Details  Name: Kevin Garrett MRN: [id number] Date of Birth: [birth year] Referring Provider: Dr. Marcelino Scot   Encounter Date: 01/24/2017  PT End of Session - 01/26/17 0943    Visit Number  13    Number of Visits  25    Date for PT Re-Evaluation  03/13/17    PT Start Time  1022    PT Stop Time  1100    PT Time Calculation (min)  38 min    Activity Tolerance  Patient tolerated treatment well    Behavior During Therapy  [facility] for tasks assessed/performed       Past Medical History:  Diagnosis Date  . Anxiety   . Diabetes mellitus without complication ([place])   . Hypertension   . Nicotine dependence 10/18/2016  . Sleep apnea   . Vitamin D deficiency 10/18/2016    Past Surgical History:  Procedure Laterality Date  . OPEN REDUCTION INTERNAL FIXATION (ORIF) LEFT DISTAL HUMERUS FRACTURE Left 10/17/2016   Performed by [place] [place], MD at [facility] OR    There were no vitals filed for this visit.  Subjective Assessment - 01/26/17 0944    Subjective  Patient reports he is having less discomfort while lifting his arm, he is noticing a significant improvement in his strength. He still has some pain and difficulty with the initial portion of lifting his arm.     Limitations  House hold activities;Lifting    Diagnostic tests  Extensive fractures in LUE, repaired via ORIF, no full union seen yet.     Patient Stated Goals  To regain functional use of his LUE     Currently in Pain?  Yes    Pain Score  -- Pain with active motions still, no pain at rest    Pain Location  Arm    Pain Orientation  Left;Anterior;Upper    Pain Descriptors / Indicators  Aching    Pain Type  Chronic pain;Surgical pain    Pain Onset  More than a month ago    Pain Frequency  Intermittent       Scaption with 1# DB and mod A from  therapist x 10 repetitions, x15 repetitions with PT providing min A for 2 sets   3-point rows with 1# DB x 15, 3# x 15 repetitions -- 5# x 12  Bicep curls with 3# DB x 12 for 3 sets (appropriate fatigue noted)   Chest Press - 3# x 8 repetitions for 3 sets   Standing single arm rows on cable with 10# x 10 repetitions and min A from therapist for 3 sets   UE Ranger - x 5 repetitions through available ROM with appropriate technique                        PT Education - 01/26/17 0944    Education provided  Yes    Education Details  Patient continues to show good improvement in strength with each session     Person(s) Educated  Patient    Methods  Explanation;Demonstration    Comprehension  Verbalized understanding;Returned demonstration       PT Short Term Goals - 12/28/16 1648      PT SHORT TERM GOAL #1   Title  Patient will wear sling and not use LUE for any activities  to maintain precautionary activity modifications.     Baseline  Patient has been wearing sling but using LUE for minimal activities.     Time  4    Period  Weeks    Status  New        PT Long Term Goals - 12/28/16 1648      PT LONG TERM GOAL #1   Title  Patient will demonstrate at least 150 degrees of active shoulder flexion to complete ADLs.     Baseline  Not able to demonstrate at this time due to surgical precautions.     Time  16    Period  Weeks    Status  New    Target Date  03/13/17      PT LONG TERM GOAL #2   Title  Patient will be able to carry at least 10# in his LUE to complete work related functions.     Baseline  Not able to demonstrate at this time due to surgical precautions.     Time  16    Period  Weeks    Status  New    Target Date  03/13/17      PT LONG TERM GOAL #4   Title  Patient will be able to lift items <5# overhead with no increase in pain to complete ADLs.     Baseline  Not able to demonstrate at this time due to surgical precautions.     Time  16     Period  Weeks    Status  New    Target Date  03/13/17            Plan - 01/26/17 8101    Clinical Impression Statement  Patient continues to improve resistance tolerated in each movement from session to session. He continues to have pain and fatigues quickly, but has made substantial progress with functional ROM and functional strength/capacity week by week.    Clinical Presentation  Stable    Clinical Decision Making  Moderate    Rehab Potential  Good    PT Frequency  2x / week    PT Duration  12 weeks    PT Treatment/Interventions  Aquatic Therapy;Cryotherapy;Electrical Stimulation;Therapeutic exercise;Therapeutic activities;Balance training;Taping;Dry needling;Neuromuscular re-education;Patient/family education;Passive range of motion;Ultrasound;Iontophoresis 4mg /ml Dexamethasone    PT Next Visit Plan  Progress per protocol     Consulted and Agree with Plan of Care  Patient       Patient will benefit from skilled therapeutic intervention in order to improve the following deficits and impairments:  Pain, Abnormal gait, Improper body mechanics, Decreased mobility, Impaired UE functional use, Obesity, Difficulty walking, Decreased knowledge of precautions, Decreased balance, Decreased activity tolerance, Decreased endurance, Decreased range of motion, Decreased strength  Visit Diagnosis: Pain in left arm  Weakness of left arm     Problem List Patient Active Problem List   Diagnosis Date Noted  . Nicotine dependence 10/18/2016  . Vitamin D deficiency 10/18/2016  . Hypertension   . Diabetes mellitus without complication ([place])   . Sleep apnea   . Closed left scapular fracture 10/15/2016  . Closed displaced segmental fracture of shaft of left humerus 10/15/2016   Royce Macadamia PT, DPT, CSCS    01/26/2017, 9:45 AM  [facility] [address] [place], [address] Phone: [phone]   Fax:   [phone]  Name: Kevin Garrett MRN: [id number] Date of Birth: 1976/01/26

## 2017-01-29 ENCOUNTER — Ambulatory Visit: Payer: Medicaid Other | Admitting: Physical Therapy

## 2017-01-29 DIAGNOSIS — M79602 Pain in left arm: Secondary | ICD-10-CM

## 2017-01-29 DIAGNOSIS — R29898 Other symptoms and signs involving the musculoskeletal system: Secondary | ICD-10-CM

## 2017-01-29 NOTE — Therapy (Signed)
[facility] [facility] [address] [place], [address] Phone: [phone]   Fax:  [phone]  Physical Therapy Treatment  Patient Details  Name: Kevin Garrett MRN: [id number] Date of Birth: 03/30/1975 Referring Provider: Dr. Marcelino Scot   Encounter Date: 01/29/2017  PT End of Session - 01/29/17 1131    Visit Number  14    Number of Visits  25    Date for PT Re-Evaluation  03/13/17    PT Start Time  1120    PT Stop Time  1159    PT Time Calculation (min)  39 min    Activity Tolerance  Patient tolerated treatment well    Behavior During Therapy  [facility] for tasks assessed/performed       Past Medical History:  Diagnosis Date  . Anxiety   . Diabetes mellitus without complication ([place])   . Hypertension   . Nicotine dependence 10/18/2016  . Sleep apnea   . Vitamin D deficiency 10/18/2016    Past Surgical History:  Procedure Laterality Date  . OPEN REDUCTION INTERNAL FIXATION (ORIF) LEFT DISTAL HUMERUS FRACTURE Left 10/17/2016   Performed by Altamese Nettie, MD at [facility] OR    There were no vitals filed for this visit.  Subjective Assessment - 01/29/17 1121    Subjective  Patient reports he has had intermittent pain in his L shoulder, though it does not currently hurt. His L forearm pain is resolving as well.     Limitations  House hold activities;Lifting    Diagnostic tests  Extensive fractures in LUE, repaired via ORIF, no full union seen yet.     Patient Stated Goals  To regain functional use of his LUE     Currently in Pain?  No/denies       AROM - 153 degrees of active flexion   AROM abduction - 61 degrees on L side on R side to 145 degrees   UE Ranger x 12 on LUE only x 2 sets   Abduction to 70 degrees with ER on yellow t-band x 10 for 3 sets   Low rows with yellow t-band x 70for bilateral UE for 3 sets   Seated chest press -- 5# x10, 7# x10 for 2 sets   Seated rows on machine 7# x 10 pounds with bilateral UE  for 3 sets   Sidelying shoulder abductions modified with elbow bend and very min A x 15 for sets with 61mA of russian current on teres minor/infraspinatus and 110 continuous High volt stimulation on middle traps/rhomboids. With stim still cycling, performed sidelying ER with 1# Db x 12 for 3 sets                        PT Education - 01/29/17 1254    Education provided  Yes    Education Details  WIll continue to progress strength given his excellent ROM each visit.     Person(s) Educated  Patient    Methods  Explanation;Demonstration    Comprehension  Verbalized understanding;Returned demonstration       PT Short Term Goals - 12/28/16 1648      PT SHORT TERM GOAL #1   Title  Patient will wear sling and not use LUE for any activities to maintain precautionary activity modifications.     Baseline  Patient has been wearing sling but using LUE for minimal activities.     Time  4    Period  Weeks    Status  New        PT Long Term Goals - 12/28/16 1648      PT LONG TERM GOAL #1   Title  Patient will demonstrate at least 150 degrees of active shoulder flexion to complete ADLs.     Baseline  Not able to demonstrate at this time due to surgical precautions.     Time  16    Period  Weeks    Status  New    Target Date  03/13/17      PT LONG TERM GOAL #2   Title  Patient will be able to carry at least 10# in his LUE to complete work related functions.     Baseline  Not able to demonstrate at this time due to surgical precautions.     Time  16    Period  Weeks    Status  New    Target Date  03/13/17      PT LONG TERM GOAL #4   Title  Patient will be able to lift items <5# overhead with no increase in pain to complete ADLs.     Baseline  Not able to demonstrate at this time due to surgical precautions.     Time  16    Period  Weeks    Status  New    Target Date  03/13/17            Plan - 01/29/17 1254    Clinical Impression Statement  Patient  continues to demonstrate improving resistance tolerated in each session, and has maintained excellent forward elevation ROM. He is noted to have significant lack of abduction ROM secondary to strength loss, though he was able to tolerate this motion in sidelying with e-stim applied this date. He will continue to benefit from skilled PT services to improve his functional strength of his LUE.     Clinical Presentation  Stable    Clinical Decision Making  Moderate    Rehab Potential  Good    PT Frequency  2x / week    PT Duration  12 weeks    PT Treatment/Interventions  Aquatic Therapy;Cryotherapy;Electrical Stimulation;Therapeutic exercise;Therapeutic activities;Balance training;Taping;Dry needling;Neuromuscular re-education;Patient/family education;Passive range of motion;Ultrasound;Iontophoresis 4mg /ml Dexamethasone    PT Next Visit Plan  Progress per protocol     Consulted and Agree with Plan of Care  Patient       Patient will benefit from skilled therapeutic intervention in order to improve the following deficits and impairments:  Pain, Abnormal gait, Improper body mechanics, Decreased mobility, Impaired UE functional use, Obesity, Difficulty walking, Decreased knowledge of precautions, Decreased balance, Decreased activity tolerance, Decreased endurance, Decreased range of motion, Decreased strength  Visit Diagnosis: Pain in left arm  Weakness of left arm     Problem List Patient Active Problem List   Diagnosis Date Noted  . Nicotine dependence 10/18/2016  . Vitamin D deficiency 10/18/2016  . Hypertension   . Diabetes mellitus without complication ([place])   . Sleep apnea   . Closed left scapular fracture 10/15/2016  . Closed displaced segmental fracture of shaft of left humerus 10/15/2016   Royce Macadamia PT, DPT, CSCS    01/29/2017, 3:25 PM  [place] [facility] [address], [place], [address] Phone:  [phone]   Fax:  [phone]  Name: AYREN ZUMBRO MRN: [id number] Date of Birth: 10-30-1975

## 2017-01-29 NOTE — Patient Instructions (Addendum)
AROM - 153 degrees of active flexion   AROM abduction - 61 degrees on L side on R side to 145 degrees   UE Ranger x 12 on LUE only x 2 sets   Abduction to 70 degrees with ER on yellow t-band x 10 for 3 sets   Low rows with yellow t-band x 44for bilateral UE for 3 sets   Seated chest press -- 5# x10, 7# x10 for 2 sets   Seated rows on machine 7# x 10 pounds with bilateral UE for 3 sets   Sidelying shoulder abductions modified with elbow bend and very min A x 15 for sets with 84mA of russian current on teres minor/infraspinatus and 110 continuous High volt stimulation on middle traps/rhomboids

## 2017-01-31 ENCOUNTER — Encounter: Payer: Medicaid Other | Admitting: Physical Therapy

## 2017-02-05 ENCOUNTER — Ambulatory Visit: Payer: Medicaid Other | Admitting: Physical Therapy

## 2017-02-05 DIAGNOSIS — M79602 Pain in left arm: Secondary | ICD-10-CM | POA: Diagnosis not present

## 2017-02-05 DIAGNOSIS — R29898 Other symptoms and signs involving the musculoskeletal system: Secondary | ICD-10-CM

## 2017-02-05 NOTE — Patient Instructions (Addendum)
AAROM with red t-band to assist through motion - 2 sets x 20 repetitions   Rolling ball on steps into abduction x 15 repetitions to promote abduction strength for 2 sets   Chest press on OMEGA with 10# for bilateral UEs x 12 repetitions for 3 sets   Seated rows on OMEGA with 15# x 12, 20# x15, 25# x 12  Seated inverted grip pulldowns (supinated grip) with 10# x 12 for 3 sets   Standing cable ER x 8 for 3 sets at 5#  Standing mini push ups to treadmill

## 2017-02-05 NOTE — Therapy (Signed)
[place] [facility] [address], [place], [address] Phone: [phone]   Fax:  [phone]  Physical Therapy Treatment  Patient Details  Name: Kevin Garrett MRN: [id number] Date of Birth: Mar 01, 1976 Referring Provider: Dr. Marcelino Scot   Encounter Date: 02/05/2017  PT End of Session - 02/05/17 1606    Visit Number  15    Number of Visits  25    Date for PT Re-Evaluation  03/13/17    PT Start Time  1127    PT Stop Time  1206    PT Time Calculation (min)  39 min    Activity Tolerance  Patient tolerated treatment well    Behavior During Therapy  [facility] for tasks assessed/performed       Past Medical History:  Diagnosis Date  . Anxiety   . Diabetes mellitus without complication ([place])   . Hypertension   . Nicotine dependence 10/18/2016  . Sleep apnea   . Vitamin D deficiency 10/18/2016    Past Surgical History:  Procedure Laterality Date  . ORIF HUMERUS FRACTURE Left 10/17/2016   Procedure: OPEN REDUCTION INTERNAL FIXATION (ORIF) LEFT DISTAL HUMERUS FRACTURE;  Surgeon: Altamese Brewton, MD;  Location: [place];  Service: Orthopedics;  Laterality: Left;    There were no vitals filed for this visit.  Subjective Assessment - 02/05/17 1129    Subjective  Patient reports his arm has been feeling better, less pain and discomfort overall. He is having more functional use of his LUE, though he still struggles to abduct it and complete tasks like putting on shirts or jackets.     Limitations  House hold activities;Lifting    Diagnostic tests  Extensive fractures in LUE, repaired via ORIF, no full union seen yet.     Patient Stated Goals  To regain functional use of his LUE     Currently in Pain?  Other (Comment) Mostly weakness, some soreness at this point       AAROM with red t-band to assist through motion - 2 sets x 20 repetitions   Rolling ball on steps into abduction x 15 repetitions to promote abduction strength for 2 sets    Chest press on OMEGA with 10# for bilateral UEs x 12 repetitions for 3 sets   Seated rows on OMEGA with 15# x 12, 20# x15, 25# x 12  Seated inverted grip pulldowns (supinated grip) with 10# x 12 for 3 sets   Standing cable ER x 8 for 3 sets at 5#  Standing mini push ups to treadmill x 5 for 2 sets   Wall slides with foam roller x 6 for 2 sets ( patient was quite fatigued at this point, thus dc'd session)                       PT Education - 02/05/17 1605    Education provided  Yes    Education Details  Will add to his HEP in follow up session to continue progressing strengthening to allow more functional use of his LUE.     Person(s) Educated  Patient    Methods  Explanation;Demonstration    Comprehension  Verbalized understanding;Returned demonstration       PT Short Term Goals - 12/28/16 1648      PT SHORT TERM GOAL #1   Title  Patient will wear sling and not use LUE for any activities to maintain precautionary activity modifications.     Baseline  Patient has been wearing sling but using LUE for minimal activities.     Time  4    Period  Weeks    Status  New        PT Long Term Goals - 12/28/16 1648      PT LONG TERM GOAL #1   Title  Patient will demonstrate at least 150 degrees of active shoulder flexion to complete ADLs.     Baseline  Not able to demonstrate at this time due to surgical precautions.     Time  16    Period  Weeks    Status  New    Target Date  03/13/17      PT LONG TERM GOAL #2   Title  Patient will be able to carry at least 10# in his LUE to complete work related functions.     Baseline  Not able to demonstrate at this time due to surgical precautions.     Time  16    Period  Weeks    Status  New    Target Date  03/13/17      PT LONG TERM GOAL #4   Title  Patient will be able to lift items <5# overhead with no increase in pain to complete ADLs.     Baseline  Not able to demonstrate at this time due to surgical  precautions.     Time  16    Period  Weeks    Status  New    Target Date  03/13/17            Plan - 02/05/17 1606    Clinical Impression Statement  Patient continues to demonstrate improving AROM in all directions and is reporting decreasing pain and increased function week by week. He continues to have significant functional limitations with lifting objects, putting his arms through sleeves of clothing, etc. but has noticed he is more able to participate in these than he had been previously.     Clinical Presentation  Stable    Clinical Decision Making  Moderate    Rehab Potential  Good    PT Frequency  2x / week    PT Duration  12 weeks    PT Treatment/Interventions  Aquatic Therapy;Cryotherapy;Electrical Stimulation;Therapeutic exercise;Therapeutic activities;Balance training;Taping;Dry needling;Neuromuscular re-education;Patient/family education;Passive range of motion;Ultrasound;Iontophoresis 4mg /ml Dexamethasone    PT Next Visit Plan  Progress per protocol     Consulted and Agree with Plan of Care  Patient       Patient will benefit from skilled therapeutic intervention in order to improve the following deficits and impairments:  Pain, Abnormal gait, Improper body mechanics, Decreased mobility, Impaired UE functional use, Obesity, Difficulty walking, Decreased knowledge of precautions, Decreased balance, Decreased activity tolerance, Decreased endurance, Decreased range of motion, Decreased strength  Visit Diagnosis: Pain in left arm  Weakness of left arm     Problem List Patient Active Problem List   Diagnosis Date Noted  . Nicotine dependence 10/18/2016  . Vitamin D deficiency 10/18/2016  . Hypertension   . Diabetes mellitus without complication ([place])   . Sleep apnea   . Closed left scapular fracture 10/15/2016  . Closed displaced segmental fracture of shaft of left humerus 10/15/2016   Royce Macadamia PT, DPT, CSCS    02/05/2017, 4:09 PM  [place] [facility] [address] [place], [address] Phone: [phone]   Fax:  [phone]  Name: NICKLAS MCSWEENEY MRN: [id number] Date  of Birth: [birth year]

## 2017-02-07 ENCOUNTER — Ambulatory Visit: Payer: Medicaid Other | Admitting: Physical Therapy

## 2017-02-07 DIAGNOSIS — M79602 Pain in left arm: Secondary | ICD-10-CM

## 2017-02-07 DIAGNOSIS — R29898 Other symptoms and signs involving the musculoskeletal system: Secondary | ICD-10-CM

## 2017-02-07 NOTE — Patient Instructions (Addendum)
UE Ranger on LUE x 20 repetitions   Standing ER with red t-band x 12 repetitions for 3 sets  Low rows with blue t-band x 12 for 3 sets (challenging, appropriately)   More full depth push ups to treadmill x 6 x 2 sets (very challenging)  Standing shoulder abduction x 10 for 2 sets with 1# DB   Press ups from plinth with feet on floor x 12  Supinated pull downs x 12 with 15#, 20# x 12  Farmer's carries with 5# in each UE

## 2017-02-07 NOTE — Therapy (Signed)
[place] [facility] [address], [place], [address] Phone: [phone]   Fax:  [phone]  Physical Therapy Treatment  Patient Details  Name: Kevin Garrett MRN: [id number] Date of Birth: [birth year] Referring Provider: Dr. Marcelino Scot   Encounter Date: 02/07/2017  PT End of Session - 02/07/17 1136    Visit Number  16    Number of Visits  25    Date for PT Re-Evaluation  03/13/17    PT Start Time  4580    PT Stop Time  1204    PT Time Calculation (min)  38 min    Activity Tolerance  Patient tolerated treatment well    Behavior During Therapy  [facility] for tasks assessed/performed       Past Medical History:  Diagnosis Date  . Anxiety   . Diabetes mellitus without complication ([place])   . Hypertension   . Nicotine dependence 10/18/2016  . Sleep apnea   . Vitamin D deficiency 10/18/2016    Past Surgical History:  Procedure Laterality Date  . ORIF HUMERUS FRACTURE Left 10/17/2016   Procedure: OPEN REDUCTION INTERNAL FIXATION (ORIF) LEFT DISTAL HUMERUS FRACTURE;  Surgeon: Altamese [employer], MD;  Location: [place];  Service: Orthopedics;  Laterality: Left;    There were no vitals filed for this visit.  Subjective Assessment - 02/07/17 1129    Subjective  Patient reports he still gets soreness/pain when he lifts his arm, but notices he is able to complete more functional activities than in previous weeks.     Limitations  House hold activities;Lifting    Diagnostic tests  Extensive fractures in LUE, repaired via ORIF, no full union seen yet.     Patient Stated Goals  To regain functional use of his LUE     Currently in Pain?  Other (Comment) Pain/weakness noted especially when he abducts his shoulder       UE Ranger on LUE x 20 repetitions   Standing ER with red t-band x 12 repetitions for 3 sets  Low rows with blue t-band x 12 for 3 sets (challenging, appropriately)   More full depth push ups to treadmill x 6 x 2 sets  (very challenging)  Standing shoulder abduction x 10 for 2 sets with 1# DB   Press ups from plinth with feet on floor x 12 for 2 sets   Supinated pull downs x 12 with 15#, 20# x 12  Farmer's carries with 5# in each UE for 3 laps around clinic, 10# in bilateral UEs x 2 laps                        PT Education - 02/07/17 1232    Education provided  Yes    Education Details  Patient is progressing well with strengthening progressions, will continue to benefit from generalized shoulder strengthening.     Person(s) Educated  Patient    Methods  Explanation;Demonstration    Comprehension  Verbalized understanding;Returned demonstration       PT Short Term Goals - 12/28/16 1648      PT SHORT TERM GOAL #1   Title  Patient will wear sling and not use LUE for any activities to maintain precautionary activity modifications.     Baseline  Patient has been wearing sling but using LUE for minimal activities.     Time  4    Period  Weeks    Status  New  PT Long Term Goals - 12/28/16 1648      PT LONG TERM GOAL #1   Title  Patient will demonstrate at least 150 degrees of active shoulder flexion to complete ADLs.     Baseline  Not able to demonstrate at this time due to surgical precautions.     Time  16    Period  Weeks    Status  New    Target Date  03/13/17      PT LONG TERM GOAL #2   Title  Patient will be able to carry at least 10# in his LUE to complete work related functions.     Baseline  Not able to demonstrate at this time due to surgical precautions.     Time  16    Period  Weeks    Status  New    Target Date  03/13/17      PT LONG TERM GOAL #4   Title  Patient will be able to lift items <5# overhead with no increase in pain to complete ADLs.     Baseline  Not able to demonstrate at this time due to surgical precautions.     Time  16    Period  Weeks    Status  New    Target Date  03/13/17            Plan - 02/07/17 1136     Clinical Impression Statement  Patient continues to be quite limited with abduction ROM, likely due to supraspinatus and deltoid weakness from prolonged immobilization. He is able to complete increased depth of push ups to treadmill this date. He is still having significant functional deficits secondary to LUE weakness from fracture and prolonged immobilization, however he is progressing week by week.     Clinical Presentation  Stable    Clinical Decision Making  Moderate    Rehab Potential  Good    PT Frequency  2x / week    PT Duration  12 weeks    PT Treatment/Interventions  Aquatic Therapy;Cryotherapy;Electrical Stimulation;Therapeutic exercise;Therapeutic activities;Balance training;Taping;Dry needling;Neuromuscular re-education;Patient/family education;Passive range of motion;Ultrasound;Iontophoresis 4mg /ml Dexamethasone    PT Next Visit Plan  Progress per protocol     Consulted and Agree with Plan of Care  Patient       Patient will benefit from skilled therapeutic intervention in order to improve the following deficits and impairments:  Pain, Abnormal gait, Improper body mechanics, Decreased mobility, Impaired UE functional use, Obesity, Difficulty walking, Decreased knowledge of precautions, Decreased balance, Decreased activity tolerance, Decreased endurance, Decreased range of motion, Decreased strength  Visit Diagnosis: Pain in left arm  Weakness of left arm     Problem List Patient Active Problem List   Diagnosis Date Noted  . Nicotine dependence 10/18/2016  . Vitamin D deficiency 10/18/2016  . Hypertension   . Diabetes mellitus without complication ([place])   . Sleep apnea   . Closed left scapular fracture 10/15/2016  . Closed displaced segmental fracture of shaft of left humerus 10/15/2016   Royce Macadamia PT, DPT, CSCS    02/07/2017, 12:36 PM  [place] [facility] [address] [place],  [address] Phone: [phone]   Fax:  [phone]  Name: Kevin Garrett MRN: [id number] Date of Birth: [birth year]

## 2017-02-12 ENCOUNTER — Ambulatory Visit: Payer: Medicaid Other | Attending: Orthopedic Surgery | Admitting: Physical Therapy

## 2017-02-12 DIAGNOSIS — M79602 Pain in left arm: Secondary | ICD-10-CM | POA: Diagnosis not present

## 2017-02-12 DIAGNOSIS — R29898 Other symptoms and signs involving the musculoskeletal system: Secondary | ICD-10-CM | POA: Diagnosis present

## 2017-02-12 NOTE — Therapy (Signed)
[facility] [address], [place], [address] Phone: [phone]   Fax:  [phone]  Physical Therapy Treatment  Patient Details  Name: Kevin Garrett MRN: [id number] Date of Birth: 03-24-1975 Referring Provider: Dr. Marcelino Scot   Encounter Date: 02/12/2017  PT End of Session - 02/12/17 1431    Visit Number  17    Number of Visits  25    Date for PT Re-Evaluation  03/13/17    PT Start Time  1350    PT Stop Time  1430    PT Time Calculation (min)  40 min    Activity Tolerance  Patient tolerated treatment well    Behavior During Therapy  [facility] for tasks assessed/performed       Past Medical History:  Diagnosis Date  . Anxiety   . Diabetes mellitus without complication ([place])   . Hypertension   . Nicotine dependence 10/18/2016  . Sleep apnea   . Vitamin D deficiency 10/18/2016    Past Surgical History:  Procedure Laterality Date  . ORIF HUMERUS FRACTURE Left 10/17/2016   Procedure: OPEN REDUCTION INTERNAL FIXATION (ORIF) LEFT DISTAL HUMERUS FRACTURE;  Surgeon: Altamese [place], MD;  Location: [facility];  Service: Orthopedics;  Laterality: Left;    There were no vitals filed for this visit.  Subjective Assessment - 02/12/17 1351    Subjective  Patient reports his soreness continues to decline and he has found significant progress/improvement with functional usage of his LUE.     Limitations  House hold activities;Lifting    Diagnostic tests  Extensive fractures in LUE, repaired via ORIF, no full union seen yet.     Patient Stated Goals  To regain functional use of his LUE     Currently in Pain?  Other (Comment) Mild ache/discomfort in the shoulder area with active movements    Pain Orientation  Left;Anterior;Upper    Pain Descriptors / Indicators  Aching    Pain Type  Chronic pain;Surgical pain    Pain Onset  More than a month ago    Pain Frequency  Intermittent       Abduction ROM actively after several warm up  repetitions to 120 degrees   Supinated lat pull downs 20# x 12 repetitions, 25# x 10, 35# x10  Seated rows with 35# x8 repetitions, 25# x10, for 2 sets   Standing push-ups to treadmill x 6 repetitions for 3 sets   Seated ER at 90 degrees of abduction with 1# DB x 15, 2# x15, 4# 12  Standing triceps push downs with 25# x 12 repetitions for 3 sets   Scaption to 90 degrees with 1 # DB in each hand x 12, progressed to 2# DBs x12, 3# x 10 (through roughly 80 - 90 degrees)   Bicep curls on LUE with 5# x 12 (easy) 8# x10 for 2 sets                       PT Education - 02/12/17 1431    Education provided  Yes    Education Details  Will update HEP to progress at home strengthening program in next session,     Person(s) Educated  Patient    Methods  Explanation;Demonstration    Comprehension  Verbalized understanding;Returned demonstration       PT Short Term Goals - 12/28/16 1648      PT SHORT TERM GOAL #1   Title  Patient will wear  sling and not use LUE for any activities to maintain precautionary activity modifications.     Baseline  Patient has been wearing sling but using LUE for minimal activities.     Time  4    Period  Weeks    Status  New        PT Long Term Goals - 12/28/16 1648      PT LONG TERM GOAL #1   Title  Patient will demonstrate at least 150 degrees of active shoulder flexion to complete ADLs.     Baseline  Not able to demonstrate at this time due to surgical precautions.     Time  16    Period  Weeks    Status  New    Target Date  03/13/17      PT LONG TERM GOAL #2   Title  Patient will be able to carry at least 10# in his LUE to complete work related functions.     Baseline  Not able to demonstrate at this time due to surgical precautions.     Time  16    Period  Weeks    Status  New    Target Date  03/13/17      PT LONG TERM GOAL #4   Title  Patient will be able to lift items <5# overhead with no increase in pain to complete  ADLs.     Baseline  Not able to demonstrate at this time due to surgical precautions.     Time  16    Period  Weeks    Status  New    Target Date  03/13/17            Plan - 02/12/17 1431    Clinical Impression Statement  Patient has made excellent progress with LUE functional AROM, his abduction has nearly doubled in the last two weeks. He is tolerating heavier loads, though still has residual pain/weakness from prolonged immobilization. He is progressing wel,l and will be started on an HEP to facilitate improved strengthening in follow up session.     Clinical Presentation  Stable    Clinical Decision Making  Moderate    Rehab Potential  Good    PT Frequency  2x / week    PT Duration  12 weeks    PT Treatment/Interventions  Aquatic Therapy;Cryotherapy;Electrical Stimulation;Therapeutic exercise;Therapeutic activities;Balance training;Taping;Dry needling;Neuromuscular re-education;Patient/family education;Passive range of motion;Ultrasound;Iontophoresis 4mg /ml Dexamethasone    PT Next Visit Plan  Progress per protocol     Consulted and Agree with Plan of Care  Patient       Patient will benefit from skilled therapeutic intervention in order to improve the following deficits and impairments:  Pain, Abnormal gait, Improper body mechanics, Decreased mobility, Impaired UE functional use, Obesity, Difficulty walking, Decreased knowledge of precautions, Decreased balance, Decreased activity tolerance, Decreased endurance, Decreased range of motion, Decreased strength  Visit Diagnosis: Pain in left arm  Weakness of left arm     Problem List Patient Active Problem List   Diagnosis Date Noted  . Nicotine dependence 10/18/2016  . Vitamin D deficiency 10/18/2016  . Hypertension   . Diabetes mellitus without complication ([place])   . Sleep apnea   . Closed left scapular fracture 10/15/2016  . Closed displaced segmental fracture of shaft of left humerus 10/15/2016   Royce Macadamia  PT, DPT, CSCS     02/12/2017, 3:20 PM  [facility] [address] [place],  [place], [address] Phone: [phone]   Fax:  [phone]  Name: RAYON MCCHRISTIAN MRN: [id number] Date of Birth: 11/07/1975

## 2017-02-12 NOTE — Patient Instructions (Addendum)
Abduction ROM actively after several warm up repetitions to 120 degrees   Supinated lat pull downs 20# x 12 repetitions, 25# x 10, 35# x10  Seated rows with 35# x8 repetitions, 25# x10, for 2 sets   Standing push-ups to treadmill x 6 repetitions for 3 sets   Seated ER at 90 degrees of abduction with 1# DB x 15, 2# x15, 4# 12  Standing triceps push downs with 25# x 12 repetitions for 3 sets   Scaption to 90 degrees with 1 # DB in each hand x 12, progressed to 2# DBs x12, 3# x 10 (through roughly 80 - 90 degrees)   Bicep curls on LUE with 5# x 12 (easy) 8# x10 for 2 sets

## 2017-02-14 ENCOUNTER — Encounter: Payer: Medicaid Other | Admitting: Physical Therapy

## 2017-02-20 ENCOUNTER — Ambulatory Visit: Payer: Medicaid Other | Admitting: Physical Therapy

## 2017-02-22 ENCOUNTER — Ambulatory Visit: Payer: Medicaid Other | Admitting: Physical Therapy

## 2017-02-22 DIAGNOSIS — M79602 Pain in left arm: Secondary | ICD-10-CM | POA: Diagnosis not present

## 2017-02-22 DIAGNOSIS — R29898 Other symptoms and signs involving the musculoskeletal system: Secondary | ICD-10-CM

## 2017-02-22 NOTE — Patient Instructions (Addendum)
Soft tissue mobilization to long head of biceps tendon, anterior deltoid, and pec minor insertion -- well tolerated patient reported decreased pain after completion.   Standing rows with bilateral UEs with 25# x 12, 35# x 10 for 2 sets, 45# x8   Standing ERs on cable with 5# with PT assisting in controlling weight x 8 for 3 sets (cuing for appropriate technique with manual cues)   BodyBlade (vertical orientation, horizontal orientation) x10" for 2 bouts   Chest Press -- 5# x 12, 8# x 8 repetitions, 8# x 10 repetitions   Prone horizontal abduction with ER with very min A x 12, progressed to 1# x10 for 3 sets with min A

## 2017-02-22 NOTE — Therapy (Signed)
[facility] [address], [place], [address] Phone: [phone]   Fax:  [phone]  Physical Therapy Treatment  Patient Details  Name: Kevin Garrett MRN: [id number] Date of Birth: 1975-11-09 Referring Provider: Dr. Marcelino Scot   Encounter Date: 02/22/2017  PT End of Session - 02/22/17 1005    Visit Number  18    Number of Visits  25    Date for PT Re-Evaluation  03/13/17    PT Start Time  0948    PT Stop Time  1030    PT Time Calculation (min)  42 min    Activity Tolerance  Patient tolerated treatment well    Behavior During Therapy  [facility] for tasks assessed/performed       Past Medical History:  Diagnosis Date  . Anxiety   . Diabetes mellitus without complication ([place])   . Hypertension   . Nicotine dependence 10/18/2016  . Sleep apnea   . Vitamin D deficiency 10/18/2016    Past Surgical History:  Procedure Laterality Date  . ORIF HUMERUS FRACTURE Left 10/17/2016   Procedure: OPEN REDUCTION INTERNAL FIXATION (ORIF) LEFT DISTAL HUMERUS FRACTURE;  Surgeon: Altamese [place], MD;  Location: [facility];  Service: Orthopedics;  Laterality: Left;    There were no vitals filed for this visit.  Subjective Assessment - 02/22/17 1001    Subjective  Patient reports he is having some soreness in his upper L arm, he thinks from carrying a bag of groceries half a mile yesterday. He is otherwise doing well.     Limitations  House hold activities;Lifting    Diagnostic tests  Extensive fractures in LUE, repaired via ORIF, no full union seen yet.     Patient Stated Goals  To regain functional use of his LUE     Currently in Pain?  Yes    Pain Score  -- Mild to moderate soreness in upper L arm    Pain Location  Arm    Pain Orientation  Left;Upper    Pain Descriptors / Indicators  Aching    Pain Onset  More than a month ago    Pain Frequency  Constant       Soft tissue mobilization to long head of biceps tendon, anterior  deltoid, and pec minor insertion -- well tolerated patient reported decreased pain after completion.   Standing rows with bilateral UEs with 25# x 12, 35# x 10 for 2 sets, 45# x8   Standing ERs on cable with 5# with PT assisting in controlling weight x 8 for 3 sets (cuing for appropriate technique with manual cues)   BodyBlade (vertical orientation, horizontal orientation) x10" for 2 bouts   Chest Press -- 5# x 12, 8# x 8 repetitions, 8# x 10 repetitions   Prone horizontal abduction with ER with very min A x 12, progressed to 1# x10 for 3 sets with min A                        PT Education - 02/22/17 1005    Education provided  Yes    Education Details  Continuing to demonstrate improved UE strength, will continue to progress as tolerated.     Person(s) Educated  Patient    Methods  Explanation;Demonstration    Comprehension  Verbalized understanding;Returned demonstration       PT Short Term Goals - 12/28/16 1648      PT SHORT TERM GOAL #1  Title  Patient will wear sling and not use LUE for any activities to maintain precautionary activity modifications.     Baseline  Patient has been wearing sling but using LUE for minimal activities.     Time  4    Period  Weeks    Status  New        PT Long Term Goals - 12/28/16 1648      PT LONG TERM GOAL #1   Title  Patient will demonstrate at least 150 degrees of active shoulder flexion to complete ADLs.     Baseline  Not able to demonstrate at this time due to surgical precautions.     Time  16    Period  Weeks    Status  New    Target Date  03/13/17      PT LONG TERM GOAL #2   Title  Patient will be able to carry at least 10# in his LUE to complete work related functions.     Baseline  Not able to demonstrate at this time due to surgical precautions.     Time  16    Period  Weeks    Status  New    Target Date  03/13/17      PT LONG TERM GOAL #4   Title  Patient will be able to lift items <5# overhead  with no increase in pain to complete ADLs.     Baseline  Not able to demonstrate at this time due to surgical precautions.     Time  16    Period  Weeks    Status  New    Target Date  03/13/17            Plan - 02/22/17 1006    Clinical Impression Statement  Patient initially reports pain today after carrying groceries yesterday. He is demonstrating improving UE strength with all motions and ranges this date and is able to tolerate prone horizontal abduction for the first time this date to increase MT/LT strength. He is progressing well, however still limited in motions such as putting on shirts and carrying grocieries.     Clinical Presentation  Stable    Clinical Decision Making  Moderate    Rehab Potential  Good    PT Frequency  2x / week    PT Duration  12 weeks    PT Treatment/Interventions  Aquatic Therapy;Cryotherapy;Electrical Stimulation;Therapeutic exercise;Therapeutic activities;Balance training;Taping;Dry needling;Neuromuscular re-education;Patient/family education;Passive range of motion;Ultrasound;Iontophoresis 4mg /ml Dexamethasone    PT Next Visit Plan  Progress per protocol     Consulted and Agree with Plan of Care  Patient       Patient will benefit from skilled therapeutic intervention in order to improve the following deficits and impairments:  Pain, Abnormal gait, Improper body mechanics, Decreased mobility, Impaired UE functional use, Obesity, Difficulty walking, Decreased knowledge of precautions, Decreased balance, Decreased activity tolerance, Decreased endurance, Decreased range of motion, Decreased strength  Visit Diagnosis: Pain in left arm  Weakness of left arm     Problem List Patient Active Problem List   Diagnosis Date Noted  . Nicotine dependence 10/18/2016  . Vitamin D deficiency 10/18/2016  . Hypertension   . Diabetes mellitus without complication ([place])   . Sleep apnea   . Closed left scapular fracture 10/15/2016  . Closed displaced  segmental fracture of shaft of left humerus 10/15/2016   Royce Macadamia PT, DPT, CSCS     02/22/2017, 1:19 PM  [facility] [address], [place], [address] Phone: [phone]   Fax:  [phone]  Name: Kevin Garrett MRN: [id number] Date of Birth: 1976/02/06

## 2017-02-26 ENCOUNTER — Other Ambulatory Visit: Payer: Self-pay

## 2017-02-26 ENCOUNTER — Encounter: Payer: Self-pay | Admitting: Physical Therapy

## 2017-02-26 ENCOUNTER — Ambulatory Visit: Payer: Medicaid Other | Admitting: Physical Therapy

## 2017-02-26 DIAGNOSIS — M79602 Pain in left arm: Secondary | ICD-10-CM | POA: Diagnosis not present

## 2017-02-26 DIAGNOSIS — R29898 Other symptoms and signs involving the musculoskeletal system: Secondary | ICD-10-CM

## 2017-02-26 NOTE — Therapy (Signed)
[facility] [address] [place], [address] Phone: [phone]   Fax:  [phone]  Physical Therapy Treatment  Patient Details  Name: Kevin Garrett MRN: [id number] Date of Birth: [birth year] Referring Provider: Dr. Marcelino Scot   Encounter Date: 02/26/2017  PT End of Session - 02/26/17 1454    Visit Number  19    Number of Visits  25    Date for PT Re-Evaluation  03/13/17    PT Start Time  5427    PT Stop Time  1532    PT Time Calculation (min)  38 min    Activity Tolerance  Patient tolerated treatment well    Behavior During Therapy  [facility] for tasks assessed/performed       Past Medical History:  Diagnosis Date  . Anxiety   . Diabetes mellitus without complication ([place])   . Hypertension   . Nicotine dependence 10/18/2016  . Sleep apnea   . Vitamin D deficiency 10/18/2016    Past Surgical History:  Procedure Laterality Date  . ORIF HUMERUS FRACTURE Left 10/17/2016   Procedure: OPEN REDUCTION INTERNAL FIXATION (ORIF) LEFT DISTAL HUMERUS FRACTURE;  Surgeon: Altamese , MD;  Location: [facility];  Service: Orthopedics;  Laterality: Left;    There were no vitals filed for this visit.  Subjective Assessment - 02/26/17 1455    Subjective  Pt reports he is doing well.  Pt reports he has come a long way since starting therapy but wants to continue working on strengthening.     Limitations  House hold activities;Lifting    Diagnostic tests  Extensive fractures in LUE, repaired via ORIF, no full union seen yet.     Patient Stated Goals  To regain functional use of his LUE     Currently in Pain?  Yes    Pain Location  Arm    Pain Orientation  Left    Pain Descriptors / Indicators  Aching    Pain Onset  More than a month ago    Multiple Pain Sites  No       TREATMENT   Carrying 7# dumbbells in each UE x400 ft, pt reports fatigue with this   Holding 5# dumbbell overhead x20 sec, x26 sec, x 32 seconds with  compensatory thoracic extension at end of last rep   BodyBlade (vertical orientation, horizontal orientation) x10" for 2 bouts   Serratus anterior press into small pink theraball with controlled slow clockwise circles x10 and counterclockwise x10. 2 sets.   Standing rows with bilateral UEs with 25# x 12, 45# x8   Chest pass with 6.6# ball against trampoline x30   Standing ERs on cable with 5# with towel roll between body and arm. x5 reps x2 sets. PT assist with maintaining 90 deg elbow flexion.   Chest Press -- 10# x 10, 15# x10, 20# x6 significantly challenging for the pt      PT Education - 02/26/17 1453    Education provided  Yes    Education Details  Exercise technique    Person(s) Educated  Patient    Methods  Explanation;Demonstration;Tactile cues    Comprehension  Verbalized understanding;Returned demonstration;Verbal cues required;Need further instruction       PT Short Term Goals - 12/28/16 1648      PT SHORT TERM GOAL #1   Title  Patient will wear sling and not use LUE for any activities to maintain precautionary activity modifications.     Baseline  Patient has been wearing sling but using LUE for minimal activities.     Time  4    Period  Weeks    Status  New        PT Long Term Goals - 12/28/16 1648      PT LONG TERM GOAL #1   Title  Patient will demonstrate at least 150 degrees of active shoulder flexion to complete ADLs.     Baseline  Not able to demonstrate at this time due to surgical precautions.     Time  16    Period  Weeks    Status  New    Target Date  03/13/17      PT LONG TERM GOAL #2   Title  Patient will be able to carry at least 10# in his LUE to complete work related functions.     Baseline  Not able to demonstrate at this time due to surgical precautions.     Time  16    Period  Weeks    Status  New    Target Date  03/13/17      PT LONG TERM GOAL #4   Title  Patient will be able to lift items <5# overhead with no increase in pain  to complete ADLs.     Baseline  Not able to demonstrate at this time due to surgical precautions.     Time  16    Period  Weeks    Status  New    Target Date  03/13/17            Plan - 02/26/17 1532    Clinical Impression Statement  Focused today's effort on strengthening of LUE.  Pt demonstrates poor endurance with strengthening exercises and requires cues and assist at times for correct technique.  Pt will benefit from continued skilled PT interventions for improved strengthening and functional use of LUE.      Rehab Potential  Good    PT Frequency  2x / week    PT Duration  12 weeks    PT Treatment/Interventions  Aquatic Therapy;Cryotherapy;Electrical Stimulation;Therapeutic exercise;Therapeutic activities;Balance training;Taping;Dry needling;Neuromuscular re-education;Patient/family education;Passive range of motion;Ultrasound;Iontophoresis 4mg /ml Dexamethasone    PT Next Visit Plan  Progress per protocol     Consulted and Agree with Plan of Care  Patient       Patient will benefit from skilled therapeutic intervention in order to improve the following deficits and impairments:  Pain, Abnormal gait, Improper body mechanics, Decreased mobility, Impaired UE functional use, Obesity, Difficulty walking, Decreased knowledge of precautions, Decreased balance, Decreased activity tolerance, Decreased endurance, Decreased range of motion, Decreased strength  Visit Diagnosis: Pain in left arm  Weakness of left arm     Problem List Patient Active Problem List   Diagnosis Date Noted  . Nicotine dependence 10/18/2016  . Vitamin D deficiency 10/18/2016  . Hypertension   . Diabetes mellitus without complication ([place])   . Sleep apnea   . Closed left scapular fracture 10/15/2016  . Closed displaced segmental fracture of shaft of left humerus 10/15/2016    Collie Siad PT, DPT 02/26/2017, 3:34 PM  [facility] [address] [place], [address] Phone: [phone]   Fax:  [phone]  Name: Kevin Garrett MRN: [id number] Date of Birth: 31-Aug-1975

## 2017-02-27 ENCOUNTER — Encounter: Payer: Medicaid Other | Admitting: Physical Therapy

## 2017-03-01 ENCOUNTER — Other Ambulatory Visit: Payer: Self-pay

## 2017-03-01 ENCOUNTER — Ambulatory Visit: Payer: Medicaid Other | Admitting: Physical Therapy

## 2017-03-01 ENCOUNTER — Encounter: Payer: Self-pay | Admitting: Physical Therapy

## 2017-03-01 DIAGNOSIS — R29898 Other symptoms and signs involving the musculoskeletal system: Secondary | ICD-10-CM

## 2017-03-01 DIAGNOSIS — M79602 Pain in left arm: Secondary | ICD-10-CM | POA: Diagnosis not present

## 2017-03-01 NOTE — Therapy (Signed)
[facility] [address] [place], [address] Phone: [phone]   Fax:  [phone]  Physical Therapy Treatment  Patient Details  Name: Kevin Garrett MRN: [id number] Date of Birth: Sep 20, 1975 Referring Provider: Dr. Marcelino Scot   Encounter Date: 03/01/2017  PT End of Session - 03/01/17 1048    Visit Number  20    Number of Visits  25    Date for PT Re-Evaluation  03/13/17    PT Start Time  4132    PT Stop Time  1128    PT Time Calculation (min)  41 min    Activity Tolerance  Patient tolerated treatment well    Behavior During Therapy  [facility] for tasks assessed/performed       Past Medical History:  Diagnosis Date  . Anxiety   . Diabetes mellitus without complication ([place])   . Hypertension   . Nicotine dependence 10/18/2016  . Sleep apnea   . Vitamin D deficiency 10/18/2016    Past Surgical History:  Procedure Laterality Date  . ORIF HUMERUS FRACTURE Left 10/17/2016   Procedure: OPEN REDUCTION INTERNAL FIXATION (ORIF) LEFT DISTAL HUMERUS FRACTURE;  Surgeon: Altamese [place]ville, MD;  Location: [facility];  Service: Orthopedics;  Laterality: Left;    There were no vitals filed for this visit.  Subjective Assessment - 03/01/17 1049    Subjective  Pt reports he is doing well.  No new complaints or concerns this date.  He is still slightly sore from last session.      Limitations  House hold activities;Lifting    Diagnostic tests  Extensive fractures in LUE, repaired via ORIF, no full union seen yet.     Patient Stated Goals  To regain functional use of his LUE     Currently in Pain?  Yes    Pain Score  3     Pain Location  Arm    Pain Orientation  Left    Pain Descriptors / Indicators  Sore    Pain Onset  More than a month ago    Multiple Pain Sites  No        TREATMENT   Carrying 7# dumbbells in each UE ambulating x800 ft   Holding 5# dumbbell overhead x28 sec, 29x sec with cues to avoid compensatory thoracic  extension and R lateral lean   BodyBlade (vertical orientation, horizontal orientation) x10" for 2 bouts   Pt rolling blue medicine ball overhead on wall x10. 5 second holds at top for stretch.   LUE serratus anterior press into small pink theraball with controlled slow clockwise circles x10 and counterclockwise x10. 2 sets.   Standing rows with bilateral UEs with 25# x 12, 45# 2x8 with verbal and tactile cues to squeeze shoulder blades   Chest pass with 6.6# ball against trampoline x30   Side pass pushing with LUE with 6.6# ball against trampoline x15 with significant fatigue   Chest Press -- 15# 3x10 challenging for the pt   Rhythmic stabilization in standing with L shoulder at 90 deg 2x20 seconds   Pt reports significant fatigue at end of session     PT Education - 03/01/17 1047    Education provided  Yes    Education Details  Exercise technique; continued focus on strengthening    Person(s) Educated  Patient    Methods  Explanation;Demonstration;Verbal cues    Comprehension  Verbalized understanding;Returned demonstration;Verbal cues required;Need further instruction       PT  Short Term Goals - 12/28/16 1648      PT SHORT TERM GOAL #1   Title  Patient will wear sling and not use LUE for any activities to maintain precautionary activity modifications.     Baseline  Patient has been wearing sling but using LUE for minimal activities.     Time  4    Period  Weeks    Status  New        PT Long Term Goals - 12/28/16 1648      PT LONG TERM GOAL #1   Title  Patient will demonstrate at least 150 degrees of active shoulder flexion to complete ADLs.     Baseline  Not able to demonstrate at this time due to surgical precautions.     Time  16    Period  Weeks    Status  New    Target Date  03/13/17      PT LONG TERM GOAL #2   Title  Patient will be able to carry at least 10# in his LUE to complete work related functions.     Baseline  Not able to demonstrate at this  time due to surgical precautions.     Time  16    Period  Weeks    Status  New    Target Date  03/13/17      PT LONG TERM GOAL #4   Title  Patient will be able to lift items <5# overhead with no increase in pain to complete ADLs.     Baseline  Not able to demonstrate at this time due to surgical precautions.     Time  16    Period  Weeks    Status  New    Target Date  03/13/17            Plan - 03/01/17 1112    Clinical Impression Statement  Pt presents to session sore from previous session so pt was provided with multiple rest breaks between exercises.  Pt demonstrates fatigue at end of each set of strengthening exercises this session and encouragement and motivation provided to challenge himself.  Pt will benefit from continued skilled PT interventions for improved strength and QOL.     Rehab Potential  Good    PT Frequency  2x / week    PT Duration  12 weeks    PT Treatment/Interventions  Aquatic Therapy;Cryotherapy;Electrical Stimulation;Therapeutic exercise;Therapeutic activities;Balance training;Taping;Dry needling;Neuromuscular re-education;Patient/family education;Passive range of motion;Ultrasound;Iontophoresis 4mg /ml Dexamethasone    PT Next Visit Plan  Progress per protocol     Consulted and Agree with Plan of Care  Patient       Patient will benefit from skilled therapeutic intervention in order to improve the following deficits and impairments:  Pain, Abnormal gait, Improper body mechanics, Decreased mobility, Impaired UE functional use, Obesity, Difficulty walking, Decreased knowledge of precautions, Decreased balance, Decreased activity tolerance, Decreased endurance, Decreased range of motion, Decreased strength  Visit Diagnosis: Pain in left arm  Weakness of left arm     Problem List Patient Active Problem List   Diagnosis Date Noted  . Nicotine dependence 10/18/2016  . Vitamin D deficiency 10/18/2016  . Hypertension   . Diabetes mellitus without  complication ([place])   . Sleep apnea   . Closed left scapular fracture 10/15/2016  . Closed displaced segmental fracture of shaft of left humerus 10/15/2016    Collie Siad PT, DPT 03/01/2017, 11:31 AM  [facility] [address] [place], [address] Phone: [phone]   Fax:  [phone]  Name: Kevin Garrett MRN: [id number] Date of Birth: 31-Oct-1975

## 2017-03-08 ENCOUNTER — Ambulatory Visit: Payer: Medicaid Other | Admitting: Physical Therapy

## 2017-03-15 ENCOUNTER — Ambulatory Visit: Payer: No Typology Code available for payment source | Admitting: Physical Therapy

## 2017-03-20 ENCOUNTER — Ambulatory Visit: Payer: No Typology Code available for payment source | Attending: Orthopedic Surgery

## 2017-03-20 DIAGNOSIS — R29898 Other symptoms and signs involving the musculoskeletal system: Secondary | ICD-10-CM | POA: Insufficient documentation

## 2017-03-20 DIAGNOSIS — M79602 Pain in left arm: Secondary | ICD-10-CM | POA: Insufficient documentation

## 2017-03-22 ENCOUNTER — Ambulatory Visit: Payer: No Typology Code available for payment source

## 2017-03-22 ENCOUNTER — Other Ambulatory Visit: Payer: Self-pay

## 2017-03-22 DIAGNOSIS — R29898 Other symptoms and signs involving the musculoskeletal system: Secondary | ICD-10-CM

## 2017-03-22 DIAGNOSIS — M79602 Pain in left arm: Secondary | ICD-10-CM

## 2017-03-22 NOTE — Therapy (Signed)
[facility] [address] [place], [address] Phone: [phone]   Fax:  [phone]  Physical Therapy Treatment  Patient Details  Name: Kevin Garrett MRN: [id number] Date of Birth: 08-31-1975 Referring Provider: Dr. Marcelino Scot   Encounter Date: 03/22/2017  PT End of Session - 03/22/17 1126    Visit Number  21    Number of Visits  41    Date for PT Re-Evaluation  05/17/17    PT Start Time  1120    PT Stop Time  1150    PT Time Calculation (min)  30 min    Activity Tolerance  Patient tolerated treatment well    Behavior During Therapy  [facility] for tasks assessed/performed       Past Medical History:  Diagnosis Date  . Anxiety   . Diabetes mellitus without complication ([place])   . Hypertension   . Nicotine dependence 10/18/2016  . Sleep apnea   . Vitamin D deficiency 10/18/2016    Past Surgical History:  Procedure Laterality Date  . ORIF HUMERUS FRACTURE Left 10/17/2016   Procedure: OPEN REDUCTION INTERNAL FIXATION (ORIF) LEFT DISTAL HUMERUS FRACTURE;  Surgeon: Altamese Martensdale, MD;  Location: [facility];  Service: Orthopedics;  Laterality: Left;    There were no vitals filed for this visit.  Subjective Assessment - 03/22/17 1124    Subjective  Pt complains of some L arm soreness both proximal and distal to elbow as well as in the joint itself. Pain is both in the anterior and posterior arm. Pt reports that overall he is 75-80% improved since starting therapy. No specific questions or concerns at this time.     Limitations  House hold activities;Lifting    Diagnostic tests  Extensive fractures in LUE, repaired via ORIF, no full union seen yet.     Patient Stated Goals  To regain functional use of his LUE     Currently in Pain?  Yes    Pain Score  5     Pain Location  Elbow    Pain Orientation  Left    Pain Descriptors / Indicators  Sore    Pain Type  Chronic pain    Pain Onset  More than a month ago    Pain  Frequency  Constant    Multiple Pain Sites  No         [facility] PT Assessment - 03/22/17 0001      Observation/Other Assessments   Other Surveys   Other Surveys    Quick DASH   31.8%          TREATMENT   Ther-ex  Pt completed quickDASH and scored 31.8% (unbilled); 125 AROM flexion in sitting against gravity, significant shoulder hiking, 149 in supine. Pain in both positions; 85 AROM abduction in sitting against gravity, significant shoulder hiking, 102 degrees in supine. Pain in both positions; 83 degrees ER and 52 IR measured in supine; Pt able to carry 10# dumbells with straight arms and at 90 degree elbow flexion x 50';  OMEGA rows 15# 2 x 15; OMEGA chest press 10# x 11, x 10;  Pt reports significant fatigue at end of session, mild increase in tightness and pain with chest press and rows in LUE;                    PT Education - 03/22/17 1126    Education provided  Yes    Education Details  Updated goals  and plan of care    Person(s) Educated  Patient    Methods  Explanation    Comprehension  Verbalized understanding       PT Short Term Goals - 03/22/17 1127      PT SHORT TERM GOAL #1   Title  Patient will wear sling and not use LUE for any activities to maintain precautionary activity modifications.     Baseline  Patient has been wearing sling but using LUE for minimal activities.     Time  4    Period  Weeks    Status  Achieved        PT Long Term Goals - 03/22/17 1127      PT LONG TERM GOAL #1   Title  Patient will demonstrate at least 150 degrees of active shoulder flexion to complete ADLs.     Baseline  Not able to demonstrate at this time due to surgical precautions.; 03/22/17: 125 AROM in sitting against gravity, significant shoulder hiking    Time  16    Period  Weeks    Status  On-going    Target Date  05/17/17      PT LONG TERM GOAL #2   Title  Patient will be able to carry at least 10# in his LUE to complete work related  functions.     Baseline  Not able to demonstrate at this time due to surgical precautions; 03/22/17: Pt able to perform today    Time  16    Period  Weeks    Status  Achieved    Target Date  03/13/17      PT LONG TERM GOAL #3   Title  Patient will report worst pain of less than 3/10 to demonstrate improved tolerance for ADLs.     Baseline  Patient reports 7-8/10 at worst; 03/22/17: worst pain is 6/10;    Time  16    Period  Weeks    Status  On-going    Target Date  05/17/17      PT LONG TERM GOAL #4   Title  Patient will be able to lift items <5# overhead with no increase in pain to complete ADLs.     Baseline  Not able to demonstrate at this time due to surgical precautions. 03/22/17: lifts 5# overhead but with pain;    Time  16    Period  Weeks    Status  On-going    Target Date  05/17/17            Plan - 03/22/17 1127    Clinical Impression Statement  Pt reports increase in LUE soreness since Christmas. He has been sick recently and has missed multiple appointments. He is still lacking significant strength with L shoulder flexion and abduction. AROM in both dependent and gravity minimized positions for both flexion and abduction is also limited. QuickDASH score is 31.8% demonstrating significant disability related to his LUE injury. Pt will benefit from continued PT services to address deficits in LUE pain, weakness, and limited range of motion in order to return to full function at home and with work related responsibilities.     Rehab Potential  Good    PT Frequency  2x / week    PT Duration  8 weeks    PT Treatment/Interventions  Aquatic Therapy;Cryotherapy;Electrical Stimulation;Therapeutic exercise;Therapeutic activities;Balance training;Taping;Dry needling;Neuromuscular re-education;Patient/family education;Passive range of motion;Ultrasound;Iontophoresis 4mg /ml Dexamethasone    PT Next Visit Plan  Progress ROM and strengthening  Consulted and Agree with Plan of Care   Patient       Patient will benefit from skilled therapeutic intervention in order to improve the following deficits and impairments:  Pain, Abnormal gait, Improper body mechanics, Decreased mobility, Impaired UE functional use, Obesity, Difficulty walking, Decreased knowledge of precautions, Decreased balance, Decreased activity tolerance, Decreased endurance, Decreased range of motion, Decreased strength  Visit Diagnosis: Pain in left arm - Plan: PT plan of care cert/re-cert  Weakness of left arm - Plan: PT plan of care cert/re-cert     Problem List Patient Active Problem List   Diagnosis Date Noted  . Nicotine dependence 10/18/2016  . Vitamin D deficiency 10/18/2016  . Hypertension   . Diabetes mellitus without complication ([place])   . Sleep apnea   . Closed left scapular fracture 10/15/2016  . Closed displaced segmental fracture of shaft of left humerus 10/15/2016   Phillips Grout PT, DPT   Kevin Garrett 03/22/2017, 5:03 PM  [facility] [address], [place], [address] Phone: [phone]   Fax:  [phone]  Name: Kevin Garrett MRN: [id number] Date of Birth: 02-Sep-1975

## 2017-04-02 ENCOUNTER — Ambulatory Visit: Payer: No Typology Code available for payment source | Admitting: Physical Therapy

## 2017-04-05 ENCOUNTER — Ambulatory Visit: Payer: No Typology Code available for payment source | Admitting: Physical Therapy

## 2017-04-09 ENCOUNTER — Ambulatory Visit: Payer: No Typology Code available for payment source | Admitting: Physical Therapy

## 2017-04-09 ENCOUNTER — Encounter: Payer: Self-pay | Admitting: Physical Therapy

## 2017-04-09 DIAGNOSIS — R29898 Other symptoms and signs involving the musculoskeletal system: Secondary | ICD-10-CM

## 2017-04-09 DIAGNOSIS — M79602 Pain in left arm: Secondary | ICD-10-CM

## 2017-04-09 NOTE — Therapy (Signed)
[facility] [address], [place], [address] Phone: [phone]   Fax:  [phone]  Physical Therapy Treatment  Patient Details  Name: Kevin Garrett MRN: [id number] Date of Birth: [birth year] Referring Provider: Dr. Marcelino Scot   Encounter Date: 04/09/2017  PT End of Session - 04/09/17 0917    Visit Number  22    Number of Visits  41    Date for PT Re-Evaluation  05/17/17    PT Start Time  0900    PT Stop Time  0945    PT Time Calculation (min)  45 min    Activity Tolerance  Patient tolerated treatment well    Behavior During Therapy  [facility] for tasks assessed/performed       Past Medical History:  Diagnosis Date  . Anxiety   . Diabetes mellitus without complication ([place])   . Hypertension   . Nicotine dependence 10/18/2016  . Sleep apnea   . Vitamin D deficiency 10/18/2016    Past Surgical History:  Procedure Laterality Date  . ORIF HUMERUS FRACTURE Left 10/17/2016   Procedure: OPEN REDUCTION INTERNAL FIXATION (ORIF) LEFT DISTAL HUMERUS FRACTURE;  Surgeon: Altamese Carrollton, MD;  Location: [facility];  Service: Orthopedics;  Laterality: Left;    There were no vitals filed for this visit.  Subjective Assessment - 04/09/17 0901    Subjective  Pt reports no pain today, with some soreness in proximal and distal humerus. No questions or concerns at this time.     Limitations  Lifting    Diagnostic tests  Extensive fractures in LUE, repaired via ORIF, no full union seen yet.     Patient Stated Goals  To regain functional use of his LUE     Currently in Pain?  Yes    Pain Score  5     Pain Location  Elbow    Pain Orientation  Left    Pain Descriptors / Indicators  Sore    Pain Type  Chronic pain    Pain Onset  More than a month ago    Pain Frequency  Intermittent          Ther-Ex -YTI 3x 8 w/ 1# w/ VC and TC for scapular rhythm  -OMEGA Chest Press 3x 10 w/ 25# -Seated overhead press 3x 8 w/ 2# -Shoulder  flexion 3x 8 body weight only with VC and TC to decrease shoulder hiking -3x 10 IR w/ red theraband with VC for eccentric control -3x 10 ER w/ red theraband with VC for eccentric control -Doorway pec stretch 4x 45sec holds                       PT Education - 04/09/17 0915    Education provided  Yes    Education Details  post surgical muscular pain and prognosis and pec stretch added HEP    Person(s) Educated  Patient    Methods  Explanation    Comprehension  Verbalized understanding       PT Short Term Goals - 03/22/17 1127      PT SHORT TERM GOAL #1   Title  Patient will wear sling and not use LUE for any activities to maintain precautionary activity modifications.     Baseline  Patient has been wearing sling but using LUE for minimal activities.     Time  4    Period  Weeks    Status  Achieved  PT Long Term Goals - 03/22/17 1127      PT LONG TERM GOAL #1   Title  Patient will demonstrate at least 150 degrees of active shoulder flexion to complete ADLs.     Baseline  Not able to demonstrate at this time due to surgical precautions.; 03/22/17: 125 AROM in sitting against gravity, significant shoulder hiking    Time  16    Period  Weeks    Status  On-going    Target Date  05/17/17      PT LONG TERM GOAL #2   Title  Patient will be able to carry at least 10# in his LUE to complete work related functions.     Baseline  Not able to demonstrate at this time due to surgical precautions; 03/22/17: Pt able to perform today    Time  16    Period  Weeks    Status  Achieved    Target Date  03/13/17      PT LONG TERM GOAL #3   Title  Patient will report worst pain of less than 3/10 to demonstrate improved tolerance for ADLs.     Baseline  Patient reports 7-8/10 at worst; 03/22/17: worst pain is 6/10;    Time  16    Period  Weeks    Status  On-going    Target Date  05/17/17      PT LONG TERM GOAL #4   Title  Patient will be able to lift items <5#  overhead with no increase in pain to complete ADLs.     Baseline  Not able to demonstrate at this time due to surgical precautions. 03/22/17: lifts 5# overhead but with pain;    Time  16    Period  Weeks    Status  On-going    Target Date  05/17/17            Plan - 04/09/17 5732    Clinical Impression Statement  Pt reports increased soreness in the anterior upper arm that he can not otherwise discribe during all exercise. During PT palpation pt localizes pain to the pec insertion and reports pain relief with doorway pec stretch (added to HEP). Pt will follow up as scheduled    Clinical Presentation  Stable    Clinical Decision Making  Moderate    Rehab Potential  Good    PT Frequency  2x / week    PT Duration  8 weeks    PT Treatment/Interventions  Aquatic Therapy;Cryotherapy;Electrical Stimulation;Therapeutic exercise;Therapeutic activities;Balance training;Taping;Dry needling;Neuromuscular re-education;Patient/family education;Passive range of motion;Ultrasound;Iontophoresis 4mg /ml Dexamethasone    PT Next Visit Plan  Progress ROM and strengthening    PT Home Exercise Plan  Passive ER and passive overhead flexion. shoulder isometrics.    Consulted and Agree with Plan of Care  Patient       Patient will benefit from skilled therapeutic intervention in order to improve the following deficits and impairments:  Pain, Abnormal gait, Improper body mechanics, Decreased mobility, Impaired UE functional use, Obesity, Difficulty walking, Decreased knowledge of precautions, Decreased balance, Decreased activity tolerance, Decreased endurance, Decreased range of motion, Decreased strength  Visit Diagnosis: Pain in left arm  Weakness of left arm     Problem List Patient Active Problem List   Diagnosis Date Noted  . Nicotine dependence 10/18/2016  . Vitamin D deficiency 10/18/2016  . Hypertension   . Diabetes mellitus without complication ([place])   . Sleep apnea   . Closed left  scapular fracture  10/15/2016  . Closed displaced segmental fracture of shaft of left humerus 10/15/2016    Shelton Silvas PT, DPT Shelton Silvas 04/09/2017, 9:45 AM  [facility] [address] [place], [address] Phone: [phone]   Fax:  [phone]  Name: TREMAINE EARWOOD MRN: [id number] Date of Birth: March 05, 1976

## 2017-04-11 ENCOUNTER — Ambulatory Visit: Payer: No Typology Code available for payment source | Admitting: Physical Therapy

## 2017-04-16 ENCOUNTER — Ambulatory Visit: Payer: No Typology Code available for payment source | Attending: Orthopedic Surgery | Admitting: Physical Therapy

## 2017-04-18 ENCOUNTER — Ambulatory Visit: Payer: No Typology Code available for payment source | Admitting: Physical Therapy

## 2017-04-23 ENCOUNTER — Ambulatory Visit: Payer: No Typology Code available for payment source | Admitting: Physical Therapy

## 2017-04-26 ENCOUNTER — Ambulatory Visit: Payer: No Typology Code available for payment source | Admitting: Physical Therapy

## 2017-04-30 ENCOUNTER — Ambulatory Visit: Payer: No Typology Code available for payment source | Admitting: Physical Therapy

## 2017-05-03 ENCOUNTER — Ambulatory Visit: Payer: No Typology Code available for payment source | Admitting: Physical Therapy

## 2017-05-07 ENCOUNTER — Encounter: Payer: Medicaid Other | Admitting: Physical Therapy

## 2017-05-10 ENCOUNTER — Encounter: Payer: Medicaid Other | Admitting: Physical Therapy

## 2017-05-14 ENCOUNTER — Encounter: Payer: Medicaid Other | Admitting: Physical Therapy

## 2017-05-17 ENCOUNTER — Encounter: Payer: Medicaid Other | Admitting: Physical Therapy

## 2017-05-21 ENCOUNTER — Encounter: Payer: Medicaid Other | Admitting: Physical Therapy

## 2017-05-24 ENCOUNTER — Encounter: Payer: Medicaid Other | Admitting: Physical Therapy

## 2017-05-28 ENCOUNTER — Encounter: Payer: Medicaid Other | Admitting: Physical Therapy

## 2017-05-31 ENCOUNTER — Encounter: Payer: Medicaid Other | Admitting: Physical Therapy

## 2017-07-26 ENCOUNTER — Ambulatory Visit: Payer: Medicaid Other | Attending: Neurology

## 2017-07-26 DIAGNOSIS — G4733 Obstructive sleep apnea (adult) (pediatric): Secondary | ICD-10-CM | POA: Diagnosis not present

## 2017-07-26 DIAGNOSIS — F5101 Primary insomnia: Secondary | ICD-10-CM | POA: Diagnosis not present

## 2017-07-26 DIAGNOSIS — I10 Essential (primary) hypertension: Secondary | ICD-10-CM | POA: Diagnosis not present

## 2018-01-14 ENCOUNTER — Encounter: Payer: Self-pay | Admitting: Emergency Medicine

## 2018-01-14 ENCOUNTER — Emergency Department
Admission: EM | Admit: 2018-01-14 | Discharge: 2018-01-14 | Disposition: A | Discharge status: Home / Self Care | Payer: Medicaid Other | Attending: Emergency Medicine | Admitting: Emergency Medicine

## 2018-01-14 DIAGNOSIS — I10 Essential (primary) hypertension: Secondary | ICD-10-CM | POA: Diagnosis not present

## 2018-01-14 DIAGNOSIS — Y999 Unspecified external cause status: Secondary | ICD-10-CM | POA: Diagnosis not present

## 2018-01-14 DIAGNOSIS — Z79899 Other long term (current) drug therapy: Secondary | ICD-10-CM | POA: Insufficient documentation

## 2018-01-14 DIAGNOSIS — S01511A Laceration without foreign body of lip, initial encounter: Secondary | ICD-10-CM | POA: Diagnosis not present

## 2018-01-14 DIAGNOSIS — F17210 Nicotine dependence, cigarettes, uncomplicated: Secondary | ICD-10-CM | POA: Diagnosis not present

## 2018-01-14 DIAGNOSIS — F513 Sleepwalking [somnambulism]: Secondary | ICD-10-CM | POA: Diagnosis not present

## 2018-01-14 DIAGNOSIS — W19XXXA Unspecified fall, initial encounter: Secondary | ICD-10-CM | POA: Insufficient documentation

## 2018-01-14 DIAGNOSIS — K0889 Other specified disorders of teeth and supporting structures: Secondary | ICD-10-CM

## 2018-01-14 DIAGNOSIS — Y92009 Unspecified place in unspecified non-institutional (private) residence as the place of occurrence of the external cause: Secondary | ICD-10-CM | POA: Diagnosis not present

## 2018-01-14 DIAGNOSIS — Z23 Encounter for immunization: Secondary | ICD-10-CM | POA: Insufficient documentation

## 2018-01-14 DIAGNOSIS — Y9301 Activity, walking, marching and hiking: Secondary | ICD-10-CM | POA: Insufficient documentation

## 2018-01-14 DIAGNOSIS — E119 Type 2 diabetes mellitus without complications: Secondary | ICD-10-CM | POA: Insufficient documentation

## 2018-01-14 DIAGNOSIS — S0993XA Unspecified injury of face, initial encounter: Secondary | ICD-10-CM | POA: Diagnosis present

## 2018-01-14 MED ORDER — OXYCODONE-ACETAMINOPHEN 5-325 MG PO TABS: 1.0000 | ORAL_TABLET | ORAL | 0 refills | Status: DC | PRN

## 2018-01-14 MED ORDER — CHLORHEXIDINE GLUCONATE 0.12 % MT SOLN
15.0000 mL | Freq: Once | OROMUCOSAL | Status: AC
  Administered 2018-01-14: 15 mL via OROMUCOSAL
  Filled 2018-01-14: qty 15

## 2018-01-14 MED ORDER — PENICILLIN V POTASSIUM 500 MG PO TABS: 500.0000 mg | ORAL_TABLET | Freq: Four times a day (QID) | ORAL | 0 refills | Status: AC

## 2018-01-14 MED ORDER — TETANUS-DIPHTH-ACELL PERTUSSIS 5-2.5-18.5 LF-MCG/0.5 IM SUSP
INTRAMUSCULAR | Status: AC
  Administered 2018-01-14: 0.5 mL via INTRAMUSCULAR
  Filled 2018-01-14: qty 0.5

## 2018-01-14 MED ORDER — BUPIVACAINE HCL (PF) 0.5 % IJ SOLN
INTRAMUSCULAR | Status: AC
  Administered 2018-01-14: 30 mL
  Filled 2018-01-14: qty 30

## 2018-01-14 MED ORDER — TETANUS-DIPHTH-ACELL PERTUSSIS 5-2.5-18.5 LF-MCG/0.5 IM SUSP
0.5000 mL | Freq: Once | INTRAMUSCULAR | Status: AC
  Administered 2018-01-14: 0.5 mL via INTRAMUSCULAR

## 2018-01-14 MED ORDER — PENICILLIN V POTASSIUM 250 MG PO TABS
500.0000 mg | ORAL_TABLET | Freq: Once | ORAL | Status: AC
  Administered 2018-01-14: 500 mg via ORAL
  Filled 2018-01-14: qty 2

## 2018-01-14 MED ORDER — CHLORHEXIDINE GLUCONATE 0.12 % MT SOLN: 15.0000 mL | Freq: Two times a day (BID) | OROMUCOSAL | 0 refills | Status: DC

## 2018-01-14 MED ORDER — BUPIVACAINE HCL (PF) 0.5 % IJ SOLN
30.0000 mL | Freq: Once | INTRAMUSCULAR | Status: AC
  Administered 2018-01-14: 30 mL

## 2018-01-14 NOTE — ED Notes (Signed)
Patient to lobby in wheelchair with daughter. Prescriptions and discharge instructions reviewed with patient and daughter. Verbalized understanding.

## 2018-01-14 NOTE — ED Provider Notes (Signed)
[facility] Emergency Department Provider Note  ____________________________________________   First MD Initiated Contact with Patient 01/14/18 [phone]     (approximate)  I have reviewed the triage vital signs and the nursing notes.   HISTORY  Chief Complaint Laceration    HPI Kevin Garrett is a 42 y.o. male who comes to the emergency department via EMS after a mechanical fall this morning while sleepwalking injuring his lower lip and lower tooth.  He has a past medical history of morbid obesity and frequently sleepwalks.  He got up out of bed this morning tripped fell forward and struck his face.  He denies headache.  He denies neck pain.  He had sudden onset moderate severity lower lip pain.  He takes no blood thinning medication.  His bleeding is controlled at this time.   He does not remember when his last tetanus shot was.   Past Medical History:  Diagnosis Date  . Anxiety   . Diabetes mellitus without complication ([place])   . Hypertension   . Nicotine dependence 10/18/2016  . Sleep apnea   . Vitamin D deficiency 10/18/2016    Patient Active Problem List   Diagnosis Date Noted  . Nicotine dependence 10/18/2016  . Vitamin D deficiency 10/18/2016  . Hypertension   . Diabetes mellitus without complication ([place])   . Sleep apnea   . Closed left scapular fracture 10/15/2016  . Closed displaced segmental fracture of shaft of left humerus 10/15/2016    Past Surgical History:  Procedure Laterality Date  . ORIF HUMERUS FRACTURE Left 10/17/2016   Procedure: OPEN REDUCTION INTERNAL FIXATION (ORIF) LEFT DISTAL HUMERUS FRACTURE;  Surgeon: Altamese Bradley, MD;  Location: [place];  Service: Orthopedics;  Laterality: Left;    Prior to Admission medications   Medication Sig Start Date End Date Taking? Authorizing Provider  chlorhexidine (PERIDEX) 0.12 % solution Use as directed 15 mLs in the mouth or throat 2 (two) times daily. 01/14/18   Darel Hong, MD    cholecalciferol 5000 units TABS Take 2,000 Units by mouth daily. 10/20/16   Ainsley Spinner, PA-C  citalopram (CELEXA) 10 MG tablet Take 3 tablets (30 mg total) by mouth daily. 10/21/16   Ainsley Spinner, PA-C  docusate sodium (COLACE) 100 MG capsule Take 1 capsule (100 mg total) by mouth 2 (two) times daily. 10/20/16   Ainsley Spinner, PA-C  methocarbamol (ROBAXIN) 500 MG tablet Take 1-2 tablets (500-1,000 mg total) by mouth every 6 (six) hours as needed for muscle spasms. 10/20/16   Ainsley Spinner, PA-C  oxyCODONE (OXY IR/ROXICODONE) 5 MG immediate release tablet Take 1-2 tablets (5-10 mg total) by mouth every 6 (six) hours as needed for breakthrough pain (take between percocet doses fro breakthrough pain only). 10/20/16   Ainsley Spinner, PA-C  oxyCODONE-acetaminophen (PERCOCET/ROXICET) 5-325 MG tablet Take 1 tablet by mouth every 4 (four) hours as needed for severe pain. 01/14/18   Darel Hong, MD  penicillin v potassium (VEETID) 500 MG tablet Take 1 tablet (500 mg total) by mouth 4 (four) times daily for 7 days. 01/14/18 01/21/18  Darel Hong, MD  predniSONE (DELTASONE) 10 MG tablet Take 6 tablets on day 1, take 5 tablets on day 2, take 4 tablets on day 3, take 3 tablets on day 4, take 2 tablets on day 5, take 1 tablet on day 6 10/25/16   Laban Emperor, PA-C    Allergies Patient has no known allergies.  History reviewed. No pertinent family history.  Social History Social History  Tobacco Use  . Smoking status: Current Some Day Smoker    Packs/day: 0.50    Types: Cigarettes  . Smokeless tobacco: Never Used  Substance Use Topics  . Alcohol use: No  . Drug use: No    Review of Systems Constitutional: No fever/chills HEENT: Positive for lip and dental pain Cardiovascular: Denies chest pain. Respiratory: Denies shortness of breath. Gastrointestinal: No abdominal pain.  No nausea, no vomiting.  Musculoskeletal: Negative for back pain. Neurological: Negative for  headaches   ____________________________________________   PHYSICAL EXAM:  VITAL SIGNS: ED Triage Vitals  Enc Vitals Group     BP      Pulse      Resp      Temp      Temp src      SpO2      Weight      Height      Head Circumference      Peak Flow      Pain Score      Pain Loc      Pain Edu?      Excl. in [place]?     Constitutional: Morbidly obese nontoxic appropriate pickwickian Head: No trauma to his head itself.  Wound to his tooth and lip as below Mouth/Throat: Sick centimeter through and through laceration vertically midline lower lip also one slightly loose lower tooth anteriorly Neck: No stridor.   Cardiovascular: Regular rate and rhythm Respiratory: Frequent shallow breaths.  Lungs are clear Gastrointestinal: Morbidly obese soft nontender Neurologic:  Normal speech and language. No gross focal neurologic deficits are appreciated.  Skin: Laceration as above    ____________________________________________  LABS (all labs ordered are listed, but only abnormal results are displayed)  Labs Reviewed - No data to display   __________________________________________  EKG   ____________________________________________  RADIOLOGY   ____________________________________________   DIFFERENTIAL includes but not limited to  Laceration, mechanical fall, dental injury, fracture   PROCEDURES  Procedure(s) performed: Yes  .Nerve Block Date/Time: 01/14/2018 8:00 AM Performed by: Darel Hong, MD Authorized by: Darel Hong, MD   Consent:    Consent obtained:  Verbal   Consent given by:  Patient   Risks discussed:  Allergic reaction, pain, nerve damage and unsuccessful block   Alternatives discussed:  Alternative treatment Indications:    Indications:  Procedural anesthesia and pain relief Location:    Body area:  Head   Head nerve blocked: Right mental nerve.   Laterality:  Right Skin anesthesia (see MAR for exact dosages):    Skin anesthesia  method:  None Procedure details (see MAR for exact dosages):    Block needle gauge:  25 G   Anesthetic injected:  Bupivacaine 0.5% w/o epi   Steroid injected:  None   Additive injected:  None   Injection procedure:  Anatomic landmarks identified, incremental injection, anatomic landmarks palpated and negative aspiration for blood   Paresthesia:  Immediately resolved Post-procedure details:    Dressing:  None   Outcome:  Anesthesia achieved   Patient tolerance of procedure:  Tolerated well, no immediate complications .Nerve Block Date/Time: 01/14/2018 8:00 AM Performed by: Darel Hong, MD Authorized by: Darel Hong, MD   Consent:    Consent obtained:  Verbal   Consent given by:  Patient   Risks discussed:  Allergic reaction, infection, pain, nerve damage and unsuccessful block   Alternatives discussed:  Alternative treatment Indications:    Indications:  Pain relief and procedural anesthesia Location:    Body area:  Head  Head nerve blocked: Left mental nerve.   Laterality:  Left Skin anesthesia (see MAR for exact dosages):    Skin anesthesia method:  None Procedure details (see MAR for exact dosages):    Block needle gauge:  25 G   Anesthetic injected:  Bupivacaine 0.5% w/o epi   Injection procedure:  Anatomic landmarks identified, anatomic landmarks palpated, incremental injection and negative aspiration for blood Post-procedure details:    Dressing:  None   Outcome:  Pain relieved   Patient tolerance of procedure:  Tolerated well, no immediate complications .Marland KitchenLaceration Repair Date/Time: 01/14/2018 8:00 AM Performed by: Darel Hong, MD Authorized by: Darel Hong, MD   Consent:    Consent obtained:  Verbal   Consent given by:  Patient   Risks discussed:  Need for additional repair, poor wound healing, poor cosmetic result and pain   Alternatives discussed:  Referral Anesthesia (see MAR for exact dosages):    Anesthesia method:  Nerve block Laceration  details:    Location:  Lip   Lip location:  Lower interior lip   Length (cm):  6 Repair type:    Repair type:  Complex Exploration:    Limited defect created (wound extended): yes     Hemostasis achieved with:  Direct pressure   Wound exploration: wound explored through full range of motion and entire depth of wound probed and visualized     Wound extent: areolar tissue violated, fascia violated and foreign bodies/material     Wound extent: no nerve damage noted, no tendon damage noted, no underlying fracture noted and no vascular damage noted     Contaminated: no   Treatment:    Area cleansed with:  Saline   Amount of cleaning:  Standard   Irrigation solution:  Sterile saline   Visualized foreign bodies/material removed: yes     Debridement:  Minimal   Undermining:  None   Scar revision: no   Subcutaneous repair:    Suture size:  3-0   Suture material:  Vicryl   Suture technique:  Simple interrupted   Number of sutures:  3 Skin repair:    Repair method:  Sutures   Suture size:  4-0   Wound skin closure material used: Vicryl.   Suture technique:  Simple interrupted   Number of sutures:  7 Approximation:    Approximation:  Close Post-procedure details:    Dressing:  Non-adherent dressing   Patient tolerance of procedure:  Tolerated well, no immediate complications Comments:     The patient had a through and through laceration to the midline of his lower lip.  It extended anteriorly and did not quite violate the vermilion border then down inside his lip.  It was deep enough to disrupt the muscle.  I performed bilateral mental nerve blocks achieving near complete anesthesia and washed out with sterile saline and closed the wound in 2 layers after debriding a small amount of nonviable tissue.  3 sutures were placed deep and buried to bring the muscular layer together and then 7 stitches on top achieving good cosmesis    Critical Care performed:  no  ____________________________________________   INITIAL IMPRESSION / ASSESSMENT AND PLAN / ED COURSE  Pertinent labs & imaging results that were available during my care of the patient were reviewed by me and considered in my medical decision making (see chart for details).   As part of my medical decision making, I reviewed the following data within the [place] History obtained from family  if available, nursing notes, old chart and ekg, as well as notes from prior ED visits.  The patient came to the emergency department after a mechanical fall sustaining a 6 cm through and through laceration to his lower lip.  He also had one loose tooth.  I performed bilateral mental nerve blocks and closed his lip in multiple layers with good cosmesis.  I subsequently dried his lower teeth and used a flexible piece of metal that I trimmed and Dermabond it to his lower teeth to splint the loose tooth.  Tetanus updated.  Given Peridex to cleanse the wound and a first dose of penicillin VK.  Given a 2-day dental recheck for his loose tooth.  He tolerated well strict return precautions have been given.      ____________________________________________   FINAL CLINICAL IMPRESSION(S) / ED DIAGNOSES  Final diagnoses:  Lip laceration, initial encounter  Loose tooth due to trauma      NEW MEDICATIONS STARTED DURING THIS VISIT:  New Prescriptions   CHLORHEXIDINE (PERIDEX) 0.12 % SOLUTION    Use as directed 15 mLs in the mouth or throat 2 (two) times daily.   OXYCODONE-ACETAMINOPHEN (PERCOCET/ROXICET) 5-325 MG TABLET    Take 1 tablet by mouth every 4 (four) hours as needed for severe pain.   PENICILLIN V POTASSIUM (VEETID) 500 MG TABLET    Take 1 tablet (500 mg total) by mouth 4 (four) times daily for 7 days.     Note:  This document was prepared using Dragon voice recognition software and may include unintentional dictation errors.      Darel Hong, MD 01/14/18 [phone]

## 2018-01-14 NOTE — Discharge Instructions (Signed)
Today I placed a total of 10 stitches in your lip that do not need to be removed they will dissolve on their own.  In addition 1 of your front teeth was loose and I splinted it to the other teeth around it.  It is critically important that you follow-up with a dentist within 2 days for a recheck.  Please use your mouthwash 3 times a day as prescribed and take all of your antibiotics for a full week.  Return to the emergency department sooner for any concerns.  It was a pleasure to take care of you today, and thank you for coming to our emergency department.  If you have any questions or concerns before leaving please ask the nurse to grab me and I'm more than happy to go through your aftercare instructions again.  If you were prescribed any opioid pain medication today such as Norco, Vicodin, Percocet, morphine, hydrocodone, or oxycodone please make sure you do not drive when you are taking this medication as it can alter your ability to drive safely.  If you have any concerns once you are home that you are not improving or are in fact getting worse before you can make it to your follow-up appointment, please do not hesitate to call 911 and come back for further evaluation.  Darel Hong, MD

## 2018-01-14 NOTE — ED Notes (Signed)
Patient given phone to attempt to find ride home. Patient falling asleep while talking to this RN. Easily arousable to voice.

## 2018-01-14 NOTE — ED Triage Notes (Signed)
Pt arrived via EMS from home where he had a mechanical fall onto the nightstand, leaving laceration to the lower lip. Bleeding controled at this time.

## 2018-01-14 NOTE — ED Notes (Signed)
Patient states he was able to find ride. Will discharge patient once ride arrives

## 2018-02-01 ENCOUNTER — Other Ambulatory Visit: Payer: Self-pay

## 2018-02-01 ENCOUNTER — Emergency Department
Admission: EM | Admit: 2018-02-01 | Discharge: 2018-02-01 | Disposition: A | Discharge status: Home / Self Care | Payer: Medicaid Other | Attending: Emergency Medicine | Admitting: Emergency Medicine

## 2018-02-01 ENCOUNTER — Encounter: Payer: Self-pay | Admitting: Emergency Medicine

## 2018-02-01 DIAGNOSIS — K130 Diseases of lips: Secondary | ICD-10-CM | POA: Diagnosis not present

## 2018-02-01 DIAGNOSIS — I10 Essential (primary) hypertension: Secondary | ICD-10-CM | POA: Diagnosis not present

## 2018-02-01 DIAGNOSIS — G501 Atypical facial pain: Secondary | ICD-10-CM | POA: Diagnosis present

## 2018-02-01 DIAGNOSIS — E119 Type 2 diabetes mellitus without complications: Secondary | ICD-10-CM | POA: Insufficient documentation

## 2018-02-01 DIAGNOSIS — F17210 Nicotine dependence, cigarettes, uncomplicated: Secondary | ICD-10-CM | POA: Diagnosis not present

## 2018-02-01 MED ORDER — CLINDAMYCIN HCL 300 MG PO CAPS: 300.0000 mg | ORAL_CAPSULE | Freq: Three times a day (TID) | ORAL | 0 refills | Status: AC

## 2018-02-01 MED ORDER — OXYCODONE-ACETAMINOPHEN 5-325 MG PO TABS: 1.0000 | ORAL_TABLET | Freq: Four times a day (QID) | ORAL | 0 refills | Status: DC | PRN

## 2018-02-01 MED ORDER — CHLORHEXIDINE GLUCONATE 0.12 % MT SOLN: 15.0000 mL | Freq: Four times a day (QID) | OROMUCOSAL | 0 refills | Status: AC

## 2018-02-01 MED ORDER — HYDROMORPHONE HCL 1 MG/ML IJ SOLN
2.0000 mg | Freq: Once | INTRAMUSCULAR | Status: AC
  Administered 2018-02-01: 2 mg via INTRAMUSCULAR
  Filled 2018-02-01: qty 2

## 2018-02-01 MED ORDER — OXYCODONE-ACETAMINOPHEN 5-325 MG PO TABS
1.0000 | ORAL_TABLET | Freq: Once | ORAL | Status: AC
  Administered 2018-02-01: 1 via ORAL
  Filled 2018-02-01: qty 1

## 2018-02-01 MED ORDER — CLINDAMYCIN HCL 150 MG PO CAPS
300.0000 mg | ORAL_CAPSULE | Freq: Once | ORAL | Status: AC
  Administered 2018-02-01: 300 mg via ORAL
  Filled 2018-02-01: qty 2

## 2018-02-01 MED ORDER — BUPIVACAINE HCL (PF) 0.5 % IJ SOLN
30.0000 mL | Freq: Once | INTRAMUSCULAR | Status: AC
  Administered 2018-02-01: 30 mL
  Filled 2018-02-01: qty 30

## 2018-02-01 MED ORDER — HYDROMORPHONE HCL 1 MG/ML IJ SOLN: 1.0000 mg | Freq: Once | INTRAMUSCULAR | Status: DC

## 2018-02-01 NOTE — Discharge Instructions (Signed)
Please resume washing your mouth with Peridex 4 times a day making sure to get your lip thoroughly cleansed.  Begin taking clindamycin as prescribed and follow-up with the ear nose and throat surgeon this coming Monday for recheck.  Return to the emergency department sooner for any concerns.  It was a pleasure to take care of you today, and thank you for coming to our emergency department.  If you have any questions or concerns before leaving please ask the nurse to grab me and I'm more than happy to go through your aftercare instructions again.  If you were prescribed any opioid pain medication today such as Norco, Vicodin, Percocet, morphine, hydrocodone, or oxycodone please make sure you do not drive when you are taking this medication as it can alter your ability to drive safely.  If you have any concerns once you are home that you are not improving or are in fact getting worse before you can make it to your follow-up appointment, please do not hesitate to call 911 and come back for further evaluation.  Darel Hong, MD  While here in the ER today you received very powerful medicine that makes it unsafe for you to drive for the rest of the day.  Do not drive until tomorrow.

## 2018-02-01 NOTE — ED Triage Notes (Addendum)
Patient ambulatory to triage with steady gait, without difficulty or distress noted; pt st stitches placed in lower lip last Sunday, have since dissolved; now with swelling that began tonight with pain

## 2018-02-01 NOTE — ED Provider Notes (Signed)
[facility] Emergency Department Provider Note  ____________________________________________   First MD Initiated Contact with Patient 02/01/18 [phone]     (approximate)  I have reviewed the triage vital signs and the nursing notes.   HISTORY  Chief Complaint Wound Check    HPI Kevin Garrett is a 42 y.o. male who comes to the emergency department with 24 hours of painful swelling to his lower lip.  I saw the patient 2 weeks ago after he fell and bit his lower lip and I repaired his lip in multiple layers.  He reports compliance with Peridex and penicillin and his lip actually healed quite well until 24 hours ago at which point it became acutely swollen.  Symptoms came on suddenly were moderate to severe and are worse with any sort of movement.  Denies fevers or chills.  No difficulty eating drinking swallowing or breathing.  No drooling.    Past Medical History:  Diagnosis Date  . Anxiety   . Diabetes mellitus without complication ([place])   . Hypertension   . Nicotine dependence 10/18/2016  . Sleep apnea   . Vitamin D deficiency 10/18/2016    Patient Active Problem List   Diagnosis Date Noted  . Nicotine dependence 10/18/2016  . Vitamin D deficiency 10/18/2016  . Hypertension   . Diabetes mellitus without complication ([place])   . Sleep apnea   . Closed left scapular fracture 10/15/2016  . Closed displaced segmental fracture of shaft of left humerus 10/15/2016    Past Surgical History:  Procedure Laterality Date  . ORIF HUMERUS FRACTURE Left 10/17/2016   Procedure: OPEN REDUCTION INTERNAL FIXATION (ORIF) LEFT DISTAL HUMERUS FRACTURE;  Surgeon: Altamese Bartolo, MD;  Location: [place];  Service: Orthopedics;  Laterality: Left;    Prior to Admission medications   Medication Sig Start Date End Date Taking? Authorizing Provider  chlorhexidine (PERIDEX) 0.12 % solution Use as directed 15 mLs in the mouth or throat 4 (four) times daily for 10 days. 02/01/18  02/11/18  Darel Hong, MD  cholecalciferol 5000 units TABS Take 2,000 Units by mouth daily. 10/20/16   Ainsley Spinner, PA-C  citalopram (CELEXA) 10 MG tablet Take 3 tablets (30 mg total) by mouth daily. 10/21/16   Ainsley Spinner, PA-C  clindamycin (CLEOCIN) 300 MG capsule Take 1 capsule (300 mg total) by mouth 3 (three) times daily for 10 days. 02/01/18 02/11/18  Darel Hong, MD  docusate sodium (COLACE) 100 MG capsule Take 1 capsule (100 mg total) by mouth 2 (two) times daily. 10/20/16   Ainsley Spinner, PA-C  methocarbamol (ROBAXIN) 500 MG tablet Take 1-2 tablets (500-1,000 mg total) by mouth every 6 (six) hours as needed for muscle spasms. 10/20/16   Ainsley Spinner, PA-C  oxyCODONE (OXY IR/ROXICODONE) 5 MG immediate release tablet Take 1-2 tablets (5-10 mg total) by mouth every 6 (six) hours as needed for breakthrough pain (take between percocet doses fro breakthrough pain only). 10/20/16   Ainsley Spinner, PA-C  oxyCODONE-acetaminophen (PERCOCET/ROXICET) 5-325 MG tablet Take 1 tablet by mouth every 6 (six) hours as needed for severe pain. 02/01/18   Darel Hong, MD  predniSONE (DELTASONE) 10 MG tablet Take 6 tablets on day 1, take 5 tablets on day 2, take 4 tablets on day 3, take 3 tablets on day 4, take 2 tablets on day 5, take 1 tablet on day 6 10/25/16   Laban Emperor, PA-C    Allergies Patient has no known allergies.  No family history on file.  Social History  Social History   Tobacco Use  . Smoking status: Current Some Day Smoker    Packs/day: 0.50    Types: Cigarettes  . Smokeless tobacco: Never Used  Substance Use Topics  . Alcohol use: No  . Drug use: No    Review of Systems Constitutional: No fever/chills ENT: Positive for lip swelling Cardiovascular: Denies chest pain. Respiratory: Denies shortness of breath. Gastrointestinal: No abdominal pain.  No nausea, no vomiting.  Neurological: Negative for headaches   ____________________________________________   PHYSICAL  EXAM:  VITAL SIGNS: ED Triage Vitals  Enc Vitals Group     BP 02/01/18 0042 (!) 168/99     Pulse Rate 02/01/18 0042 86     Resp 02/01/18 0042 17     Temp 02/01/18 0042 98.4 F (36.9 C)     Temp Source 02/01/18 0042 Oral     SpO2 02/01/18 0042 97 %     Weight 02/01/18 0035 (!) 302 lb (137 kg)     Height 02/01/18 0035 5\' 9"  (1.753 m)     Head Circumference --      Peak Flow --      Pain Score 02/01/18 0035 10     Pain Loc --      Pain Edu? --      Excl. in [place]? --     Constitutional: Alert and oriented x4 appears somewhat uncomfortable.  Pickwickian. Head: Atraumatic. Nose: No congestion/rhinnorhea. Mouth/Throat: No trismus.  Lower lip swollen with purulent material nearly expressing from his lower lip. Neck: No stridor.   Cardiovascular: Regular rate and rhythm Respiratory: Pickwickian respirations Neurologic:  Normal speech and language. No gross focal neurologic deficits are appreciated.     ____________________________________________  LABS (all labs ordered are listed, but only abnormal results are displayed)  Labs Reviewed - No data to display   __________________________________________  EKG   ____________________________________________  RADIOLOGY   ____________________________________________   DIFFERENTIAL includes but not limited to  Cellulitis, abscess, necrotizing soft tissue infection   PROCEDURES  Procedure(s) performed: Yes  .Marland KitchenIncision and Drainage Date/Time: 02/01/2018 6:04 AM Performed by: Darel Hong, MD Authorized by: Darel Hong, MD   Consent:    Consent obtained:  Verbal   Consent given by:  Patient   Risks discussed:  Bleeding, incomplete drainage, infection and pain   Alternatives discussed:  Alternative treatment Location:    Type:  Abscess   Size:  5 cm   Location:  Mouth   Mouth location: Lower lip. Pre-procedure details:    Skin preparation:  Antiseptic wash Anesthesia (see MAR for exact dosages):     Anesthesia method:  Nerve block Procedure type:    Complexity:  Complex Procedure details:    Incision types:  Single straight   Scalpel blade:  11   Wound management:  Probed and deloculated, extensive cleaning and debrided   Drainage:  Purulent and bloody   Drainage amount:  Copious   Wound treatment:  Wound left open Post-procedure details:    Patient tolerance of procedure:  Tolerated well, no immediate complications Comments:     Roughly 5 cc of purulent material came out with linear incision and drainage to the mucosal surface of the lower lip.  I then probed and deloculated and using my fingers crushed all septations.  I did debride necrotic tissue. Archie Endo Block Date/Time: 02/01/2018 6:04 AM Performed by: Darel Hong, MD Authorized by: Darel Hong, MD   Consent:    Consent obtained:  Verbal   Consent given by:  Patient  Risks discussed:  Pain, unsuccessful block, swelling and bleeding   Alternatives discussed:  Alternative treatment Indications:    Indications:  Pain relief and procedural anesthesia Location:    Body area:  Head   Head nerve blocked: Bilateral infra mental blocks.   Laterality:  Bilateral Pre-procedure details:    Skin preparation:  Alcohol Procedure details (see MAR for exact dosages):    Block needle gauge:  25 G   Anesthetic injected:  Bupivacaine 0.5% w/o epi   Steroid injected:  None   Additive injected:  None   Injection procedure:  Anatomic landmarks identified, anatomic landmarks palpated, incremental injection and negative aspiration for blood   Paresthesia:  None Post-procedure details:    Dressing:  None   Outcome:  Pain improved    Critical Care performed: no  ____________________________________________   INITIAL IMPRESSION / ASSESSMENT AND PLAN / ED COURSE  Pertinent labs & imaging results that were available during my care of the patient were reviewed by me and considered in my medical decision making (see chart for  details).   As part of my medical decision making, I reviewed the following data within the [facility] History obtained from family if available, nursing notes, old chart and ekg, as well as notes from prior ED visits.  The patient comes to the emergency department 2 weeks after I repaired a lower lip laceration.  He did comply with Peridex and penicillin and said he initially healed well however acutely over the last 24 hours his lips become swollen and painful.  This is consistent with abscess and I blocked his lip and performed an incision and drainage then crushing all septations expressing a large amount of purulent material.  I then discussed with on-call otolaryngologist Dr. Pryor Ochoa agrees with the incision and drainage and clindamycin and Peridex and he will kindly see the patient in clinic this coming Monday.  The patient's pain is improved although with the new wound to help re-fill his Percocet.  He is discharged home in improved condition.      ____________________________________________   FINAL CLINICAL IMPRESSION(S) / ED DIAGNOSES  Final diagnoses:  Lip abscess      NEW MEDICATIONS STARTED DURING THIS VISIT:  Discharge Medication List as of 02/01/2018  6:03 AM    START taking these medications   Details  clindamycin (CLEOCIN) 300 MG capsule Take 1 capsule (300 mg total) by mouth 3 (three) times daily for 10 days., Starting Fri 02/01/2018, Until Mon 02/11/2018, Print         Note:  This document was prepared using Dragon voice recognition software and may include unintentional dictation errors.      Darel Hong, MD 02/02/18 2230

## 2018-02-01 NOTE — ED Notes (Signed)
Dr Mable Paris informed that pt has many questions he would like answered. Pt cleaned up and reassured. Pt bottom lip remains grossly swollen and bleeding.

## 2018-02-01 NOTE — ED Notes (Signed)
Nurse called for pt's ride. Pt's daughter, stating someone got on the way about five minutes ago.

## 2018-04-28 ENCOUNTER — Emergency Department: Payer: Medicaid Other

## 2018-04-28 ENCOUNTER — Emergency Department
Admission: EM | Admit: 2018-04-28 | Discharge: 2018-04-28 | Disposition: A | Discharge status: Home / Self Care | Payer: Medicaid Other | Attending: Emergency Medicine | Admitting: Emergency Medicine

## 2018-04-28 DIAGNOSIS — W19XXXA Unspecified fall, initial encounter: Secondary | ICD-10-CM

## 2018-04-28 DIAGNOSIS — W06XXXA Fall from bed, initial encounter: Secondary | ICD-10-CM | POA: Diagnosis not present

## 2018-04-28 DIAGNOSIS — S0083XA Contusion of other part of head, initial encounter: Secondary | ICD-10-CM | POA: Insufficient documentation

## 2018-04-28 DIAGNOSIS — Y92003 Bedroom of unspecified non-institutional (private) residence as the place of occurrence of the external cause: Secondary | ICD-10-CM | POA: Insufficient documentation

## 2018-04-28 DIAGNOSIS — I10 Essential (primary) hypertension: Secondary | ICD-10-CM | POA: Insufficient documentation

## 2018-04-28 DIAGNOSIS — Y9384 Activity, sleeping: Secondary | ICD-10-CM | POA: Insufficient documentation

## 2018-04-28 DIAGNOSIS — S0990XA Unspecified injury of head, initial encounter: Secondary | ICD-10-CM | POA: Diagnosis present

## 2018-04-28 DIAGNOSIS — F17210 Nicotine dependence, cigarettes, uncomplicated: Secondary | ICD-10-CM | POA: Diagnosis not present

## 2018-04-28 DIAGNOSIS — Z79899 Other long term (current) drug therapy: Secondary | ICD-10-CM | POA: Diagnosis not present

## 2018-04-28 DIAGNOSIS — Y999 Unspecified external cause status: Secondary | ICD-10-CM | POA: Insufficient documentation

## 2018-04-28 DIAGNOSIS — E119 Type 2 diabetes mellitus without complications: Secondary | ICD-10-CM | POA: Insufficient documentation

## 2018-04-28 LAB — CBC
HCT: 45.6 % (ref 39.0–52.0)
Hemoglobin: 15.4 g/dL (ref 13.0–17.0)
MCH: 28.7 pg (ref 26.0–34.0)
MCHC: 33.8 g/dL (ref 30.0–36.0)
MCV: 85.1 fL (ref 80.0–100.0)
NRBC: 0 % (ref 0.0–0.2)
PLATELETS: 234 10*3/uL (ref 150–400)
RBC: 5.36 MIL/uL (ref 4.22–5.81)
RDW: 13.6 % (ref 11.5–15.5)
WBC: 5.8 10*3/uL (ref 4.0–10.5)

## 2018-04-28 LAB — URINE DRUG SCREEN, QUALITATIVE (ARMC ONLY)
Amphetamines, Ur Screen: NOT DETECTED
BARBITURATES, UR SCREEN: NOT DETECTED
BENZODIAZEPINE, UR SCRN: NOT DETECTED
COCAINE METABOLITE, UR ~~LOC~~: POSITIVE — AB
Cannabinoid 50 Ng, Ur ~~LOC~~: NOT DETECTED
MDMA (Ecstasy)Ur Screen: NOT DETECTED
Methadone Scn, Ur: NOT DETECTED
OPIATE, UR SCREEN: NOT DETECTED
PHENCYCLIDINE (PCP) UR S: NOT DETECTED
Tricyclic, Ur Screen: NOT DETECTED

## 2018-04-28 LAB — BLOOD GAS, VENOUS
Acid-Base Excess: 7.5 mmol/L — ABNORMAL HIGH (ref 0.0–2.0)
Bicarbonate: 34.1 mmol/L — ABNORMAL HIGH (ref 20.0–28.0)
O2 SAT: 89.2 %
PATIENT TEMPERATURE: 37
pCO2, Ven: 55 mmHg (ref 44.0–60.0)
pH, Ven: 7.4 (ref 7.250–7.430)
pO2, Ven: 57 mmHg — ABNORMAL HIGH (ref 32.0–45.0)

## 2018-04-28 LAB — BASIC METABOLIC PANEL
Anion gap: 6 (ref 5–15)
BUN: 12 mg/dL (ref 6–20)
CALCIUM: 8.6 mg/dL — AB (ref 8.9–10.3)
CO2: 32 mmol/L (ref 22–32)
CREATININE: 0.94 mg/dL (ref 0.61–1.24)
Chloride: 101 mmol/L (ref 98–111)
GFR calc Af Amer: >60 mL/min (ref >60)
GLUCOSE: 152 mg/dL — AB (ref 70–99)
Potassium: 3.3 mmol/L — ABNORMAL LOW (ref 3.5–5.1)
Sodium: 139 mmol/L (ref 135–145)

## 2018-04-28 LAB — ETHANOL: Alcohol, Ethyl (B): <10 mg/dL (ref <10)

## 2018-04-28 NOTE — ED Provider Notes (Signed)
[facility] Emergency Department Provider Note  ____________________________________________  Time seen: Approximately 12:23 PM  I have reviewed the triage vital signs and the nursing notes.   HISTORY  Chief Complaint Fall   HPI Kevin Garrett is a 43 y.o. male with a history of cocaine abuse, OSA, diabetes, anxiety who presents for evaluation of fall and head trauma.  Patient reports that he rolled out of bed last night and fell onto the ground.  He hit his face onto the nightstand.  No LOC.  Patient is not on blood thinners.  This morning when he woke up he was complaining of significant pain and noticed swelling of the right cheek which prompted the visit to the emergency room.  Patient reports that the pain is moderate, sharp, constant and nonradiating.  No neck pain or back pain, no extremity pain.  Patient is very somnolent but arousable in the emergency room. 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   Past Medical History:  Diagnosis Date  . Anxiety   . Diabetes mellitus without complication ([place])   . Hypertension   . Nicotine dependence 10/18/2016  . Sleep apnea   . Vitamin D deficiency 10/18/2016    Patient Active Problem List   Diagnosis Date Noted  . Nicotine dependence 10/18/2016  . Vitamin D deficiency 10/18/2016  . Hypertension   . Diabetes mellitus without complication ([place])   . Sleep apnea   . Closed left scapular fracture 10/15/2016  . Closed displaced segmental fracture of shaft of left humerus 10/15/2016    Past Surgical History:  Procedure Laterality Date  . ORIF HUMERUS FRACTURE Left 10/17/2016   Procedure: OPEN REDUCTION INTERNAL FIXATION (ORIF) LEFT DISTAL HUMERUS FRACTURE;  Surgeon: Altamese Uehling, MD;  Location: [place];  Service: Orthopedics;  Laterality: Left;    Prior to Admission medications   Medication Sig Start Date End Date Taking? Authorizing Provider  cholecalciferol  5000 units TABS Take 2,000 Units by mouth daily. 10/20/16   Ainsley Spinner, PA-C  citalopram (CELEXA) 10 MG tablet Take 3 tablets (30 mg total) by mouth daily. 10/21/16   Ainsley Spinner, PA-C  docusate sodium (COLACE) 100 MG capsule Take 1 capsule (100 mg total) by mouth 2 (two) times daily. 10/20/16   Ainsley Spinner, PA-C  methocarbamol (ROBAXIN) 500 MG tablet Take 1-2 tablets (500-1,000 mg total) by mouth every 6 (six) hours as needed for muscle spasms. 10/20/16   Ainsley Spinner, PA-C  oxyCODONE (OXY IR/ROXICODONE) 5 MG immediate release tablet Take 1-2 tablets (5-10 mg total) by mouth every 6 (six) hours as needed for breakthrough pain (take between percocet doses fro breakthrough pain only). 10/20/16   Ainsley Spinner, PA-C  oxyCODONE-acetaminophen (PERCOCET/ROXICET) 5-325 MG tablet Take 1 tablet by mouth every 6 (six) hours as needed for severe pain. 02/01/18   Darel Hong, MD  predniSONE (DELTASONE) 10 MG tablet Take 6 tablets on day 1, take 5 tablets on day 2, take 4 tablets on day 3, take 3 tablets on day 4, take 2 tablets on day 5, take 1 tablet on day 6 10/25/16   Laban Emperor, PA-C    Allergies Patient has no known allergies.  History reviewed. No pertinent family history.  Social History Social History   Tobacco Use  . Smoking status: Current Some Day Smoker    Packs/day: 0.50    Types: Cigarettes  . Smokeless tobacco: Never Used  Substance Use Topics  . Alcohol use:  No  . Drug use: No    Review of Systems Constitutional: Negative for fever. Eyes: Negative for visual changes. ENT: + facial injury. No neck injury Cardiovascular: Negative for chest injury. Respiratory: Negative for shortness of breath. Negative for chest wall injury. Gastrointestinal: Negative for abdominal pain or injury. Genitourinary: Negative for dysuria. Musculoskeletal: Negative for back injury, negative for arm or leg pain. Skin: Negative for laceration/abrasions. Neurological: Negative for head  injury.   ____________________________________________   PHYSICAL EXAM:  VITAL SIGNS: ED Triage Vitals  Enc Vitals Group     BP 04/28/18 1021 (!) 217/135     Pulse Rate 04/28/18 1021 (!) 106     Resp 04/28/18 1021 16     Temp 04/28/18 1021 98.3 F (36.8 C)     Temp Source 04/28/18 1021 Oral     SpO2 04/28/18 1021 91 %     Weight 04/28/18 1027 (!) 305 lb (138.3 kg)     Height --      Head Circumference --      Peak Flow --      Pain Score 04/28/18 1027 9     Pain Loc --      Pain Edu? --      Excl. in Wall? --     Constitutional: Sleepy, arousable, oriented. No acute distress. Does not appear intoxicated. HEENT Head: Normocephalic and atraumatic. Face: Swelling and tenderness to palpation of the R cheek bone. Stable midface Ears: No hemotympanum bilaterally. No Battle sign Eyes: No eye injury. PERRL. No raccoon eyes Nose: Nontender. No epistaxis. No rhinorrhea Mouth/Throat: Mucous membranes are moist. No oropharyngeal blood. No dental injury. Airway patent without stridor. Normal voice. Neck: C-collar in place. No midline c-spine tenderness.  Cardiovascular: Normal rate, regular rhythm. Normal and symmetric distal pulses are present in all extremities. Pulmonary/Chest: Chest wall is stable and nontender to palpation/compression. Normal respiratory effort. Breath sounds are normal. No crepitus.  Abdominal: Soft, nontender, non distended. Musculoskeletal: Nontender with normal full range of motion in all extremities. No deformities. No thoracic or lumbar midline spinal tenderness. Pelvis is stable. Skin: Skin is warm, dry and intact. No abrasions or contutions. Psychiatric: Speech and behavior are appropriate. Neurological: Normal speech and language. Moves all extremities to command. No gross focal neurologic deficits are appreciated.  Glascow Coma Score: 4 - Opens eyes on own 6 - Follows simple motor commands 5 - Alert and oriented GCS:  15   ____________________________________________   LABS (all labs ordered are listed, but only abnormal results are displayed)  Labs Reviewed  BASIC METABOLIC PANEL - Abnormal; Notable for the following components:      Result Value   Potassium 3.3 (*)    Glucose, Bld 152 (*)    Calcium 8.6 (*)    All other components within normal limits  URINE DRUG SCREEN, QUALITATIVE ([facility] ONLY) - Abnormal; Notable for the following components:   Cocaine Metabolite,Ur [place] POSITIVE (*)    All other components within normal limits  BLOOD GAS, VENOUS - Abnormal; Notable for the following components:   pO2, Ven 57.0 (*)    Bicarbonate 34.1 (*)    Acid-Base Excess 7.5 (*)    All other components within normal limits  CBC  ETHANOL   ____________________________________________  EKG  none  ____________________________________________  RADIOLOGY  I have personally reviewed the images performed during this visit and I agree with the Radiologist's read.   Interpretation by Radiologist:  Ct Head Wo Contrast  Result Date: 04/28/2018 CLINICAL DATA:  Fell at of bed today. EXAM: CT HEAD WITHOUT CONTRAST CT MAXILLOFACIAL WITHOUT CONTRAST CT CERVICAL SPINE WITHOUT CONTRAST TECHNIQUE: Multidetector CT imaging of the head, cervical spine, and maxillofacial structures were performed using the standard protocol without intravenous contrast. Multiplanar CT image reconstructions of the cervical spine and maxillofacial structures were also generated. COMPARISON:  Previous studies 2018 FINDINGS: CT HEAD FINDINGS Brain: Motion degradation. No evidence of old or acute infarction, mass lesion, hemorrhage, hydrocephalus or extra-axial collection. Vascular: No abnormal vascular finding. Skull: No skull fracture. Other: None CT MAXILLOFACIAL FINDINGS Osseous: No facial fracture. Orbits: Negative Sinuses: Clear Soft tissues: Negative. Patient does have dental and periodontal disease, particularly of the maxillary  dentition. CT CERVICAL SPINE FINDINGS Alignment: Straightening of the normal cervical lordosis. No traumatic malalignment. Skull base and vertebrae: Limited detail because of motion and patient's size. No evidence fracture. Prominent anterior osteophytes. Soft tissues and spinal canal: Negative Disc levels: Degenerative spondylosis C3-4 through C6-7. As noted above, limited detail because of motion and patient's size. No sign of significant bony encroachment upon the canal or foramina. Upper chest: Not included Other: None IMPRESSION: Head CT.  Motion degraded.  No abnormality seen. Maxillofacial CT: No acute or traumatic finding. Cervical spine CT: No acute or traumatic finding. Cervical spondylosis. Large bridging anterior osteophytes. Electronically Signed   By: Nelson Chimes M.D.   On: 04/28/2018 12:33   Ct Cervical Spine Wo Contrast  Result Date: 04/28/2018 CLINICAL DATA:  [place] at of bed today. EXAM: CT HEAD WITHOUT CONTRAST CT MAXILLOFACIAL WITHOUT CONTRAST CT CERVICAL SPINE WITHOUT CONTRAST TECHNIQUE: Multidetector CT imaging of the head, cervical spine, and maxillofacial structures were performed using the standard protocol without intravenous contrast. Multiplanar CT image reconstructions of the cervical spine and maxillofacial structures were also generated. COMPARISON:  Previous studies 2018 FINDINGS: CT HEAD FINDINGS Brain: Motion degradation. No evidence of old or acute infarction, mass lesion, hemorrhage, hydrocephalus or extra-axial collection. Vascular: No abnormal vascular finding. Skull: No skull fracture. Other: None CT MAXILLOFACIAL FINDINGS Osseous: No facial fracture. Orbits: Negative Sinuses: Clear Soft tissues: Negative. Patient does have dental and periodontal disease, particularly of the maxillary dentition. CT CERVICAL SPINE FINDINGS Alignment: Straightening of the normal cervical lordosis. No traumatic malalignment. Skull base and vertebrae: Limited detail because of motion and  patient's size. No evidence fracture. Prominent anterior osteophytes. Soft tissues and spinal canal: Negative Disc levels: Degenerative spondylosis C3-4 through C6-7. As noted above, limited detail because of motion and patient's size. No sign of significant bony encroachment upon the canal or foramina. Upper chest: Not included Other: None IMPRESSION: Head CT.  Motion degraded.  No abnormality seen. Maxillofacial CT: No acute or traumatic finding. Cervical spine CT: No acute or traumatic finding. Cervical spondylosis. Large bridging anterior osteophytes. Electronically Signed   By: Nelson Chimes M.D.   On: 04/28/2018 12:33   Ct Maxillofacial Wo Contrast  Result Date: 04/28/2018 CLINICAL DATA:  [place] at of bed today. EXAM: CT HEAD WITHOUT CONTRAST CT MAXILLOFACIAL WITHOUT CONTRAST CT CERVICAL SPINE WITHOUT CONTRAST TECHNIQUE: Multidetector CT imaging of the head, cervical spine, and maxillofacial structures were performed using the standard protocol without intravenous contrast. Multiplanar CT image reconstructions of the cervical spine and maxillofacial structures were also generated. COMPARISON:  Previous studies 2018 FINDINGS: CT HEAD FINDINGS Brain: Motion degradation. No evidence of old or acute infarction, mass lesion, hemorrhage, hydrocephalus or extra-axial collection. Vascular: No abnormal vascular finding. Skull: No skull fracture. Other: None CT MAXILLOFACIAL FINDINGS Osseous: No facial fracture. Orbits: Negative Sinuses:  Clear Soft tissues: Negative. Patient does have dental and periodontal disease, particularly of the maxillary dentition. CT CERVICAL SPINE FINDINGS Alignment: Straightening of the normal cervical lordosis. No traumatic malalignment. Skull base and vertebrae: Limited detail because of motion and patient's size. No evidence fracture. Prominent anterior osteophytes. Soft tissues and spinal canal: Negative Disc levels: Degenerative spondylosis C3-4 through C6-7. As noted above, limited  detail because of motion and patient's size. No sign of significant bony encroachment upon the canal or foramina. Upper chest: Not included Other: None IMPRESSION: Head CT.  Motion degraded.  No abnormality seen. Maxillofacial CT: No acute or traumatic finding. Cervical spine CT: No acute or traumatic finding. Cervical spondylosis. Large bridging anterior osteophytes. Electronically Signed   By: Nelson Chimes M.D.   On: 04/28/2018 12:33      ____________________________________________   PROCEDURES  Procedure(s) performed: None Procedures Critical Care performed:  None ____________________________________________   INITIAL IMPRESSION / ASSESSMENT AND PLAN / ED COURSE  43 y.o. male with a history of cocaine abuse, OSA, diabetes, anxiety who presents for evaluation of fall and head trauma after rolling off the bed last night and hitting his face onto the nightstand.  Patient is drowsy but arousable, oriented x3, and otherwise neurologically intact.  He has bruising and tenderness to palpation over the right cheekbone.  No ocular trauma.  No CT and L-spine tenderness, no other obvious signs of trauma.  CT head cervical spine and face showing no acute abnormalities. Labs were done due to drowsiness including ethanol level, drug screen, and VBG and are pending.     _________________________ 2:04 PM on 04/28/2018 -----------------------------------------  Drug screen positive for cocaine.  Other labs within normal limits.  Patient is ambulated with steady gait.  His stepfather is here to pick him up.  Recommended ice and Tylenol for pain.  Discussed standard return precautions    As part of my medical decision making, I reviewed the following data within the [facility] notes reviewed and incorporated, Labs reviewed , Old chart reviewed, Radiograph reviewed , Notes from prior ED visits and [place] Controlled Substance Database    Pertinent labs & imaging results that were  available during my care of the patient were reviewed by me and considered in my medical decision making (see chart for details).    ____________________________________________   FINAL CLINICAL IMPRESSION(S) / ED DIAGNOSES  Final diagnoses:  Fall, initial encounter  Contusion of face, initial encounter      NEW MEDICATIONS STARTED DURING THIS VISIT:  ED Discharge Orders    None       Note:  This document was prepared using Dragon voice recognition software and may include unintentional dictation errors.    Alfred Levins, [place], MD 04/28/18 [phone]

## 2018-04-28 NOTE — ED Notes (Signed)
Pt stepfather answered phone at [phone] and states that he will come get pt.

## 2018-04-28 NOTE — ED Notes (Signed)
Patient transported to CT 

## 2018-04-28 NOTE — ED Notes (Signed)
Both of pts primary contacts did not answer phone at this time.

## 2018-04-28 NOTE — ED Triage Notes (Signed)
Pt presents via POV c/o falling out of bed. C/o right sided facial pain, bilateral knee pain, and right index finger pain. Pt extremely drowsy during triage. Falling asleep in between questions.

## 2018-05-12 ENCOUNTER — Emergency Department
Admission: EM | Admit: 2018-05-12 | Discharge: 2018-05-12 | Disposition: A | Discharge status: Home / Self Care | Payer: Medicaid Other | Attending: Emergency Medicine | Admitting: Emergency Medicine

## 2018-05-12 ENCOUNTER — Emergency Department: Payer: Medicaid Other

## 2018-05-12 ENCOUNTER — Other Ambulatory Visit: Payer: Self-pay

## 2018-05-12 ENCOUNTER — Encounter: Payer: Self-pay | Admitting: Emergency Medicine

## 2018-05-12 DIAGNOSIS — Z79899 Other long term (current) drug therapy: Secondary | ICD-10-CM | POA: Insufficient documentation

## 2018-05-12 DIAGNOSIS — I10 Essential (primary) hypertension: Secondary | ICD-10-CM | POA: Diagnosis not present

## 2018-05-12 DIAGNOSIS — F17210 Nicotine dependence, cigarettes, uncomplicated: Secondary | ICD-10-CM | POA: Diagnosis not present

## 2018-05-12 DIAGNOSIS — E119 Type 2 diabetes mellitus without complications: Secondary | ICD-10-CM | POA: Diagnosis not present

## 2018-05-12 DIAGNOSIS — M25511 Pain in right shoulder: Secondary | ICD-10-CM | POA: Diagnosis not present

## 2018-05-12 LAB — BASIC METABOLIC PANEL
Anion gap: 9 (ref 5–15)
BUN: 19 mg/dL (ref 6–20)
CO2: 29 mmol/L (ref 22–32)
Calcium: 8.7 mg/dL — ABNORMAL LOW (ref 8.9–10.3)
Chloride: 100 mmol/L (ref 98–111)
Creatinine, Ser: 1.44 mg/dL — ABNORMAL HIGH (ref 0.61–1.24)
GFR calc Af Amer: >60 mL/min (ref >60)
GFR calc non Af Amer: 59 mL/min — ABNORMAL LOW (ref >60)
Glucose, Bld: 151 mg/dL — ABNORMAL HIGH (ref 70–99)
Potassium: 3.2 mmol/L — ABNORMAL LOW (ref 3.5–5.1)
Sodium: 138 mmol/L (ref 135–145)

## 2018-05-12 LAB — CBC WITH DIFFERENTIAL/PLATELET
ABS IMMATURE GRANULOCYTES: 0.02 10*3/uL (ref 0.00–0.07)
Basophils Absolute: 0 10*3/uL (ref 0.0–0.1)
Basophils Relative: 1 %
Eosinophils Absolute: 0.1 10*3/uL (ref 0.0–0.5)
Eosinophils Relative: 1 %
HEMATOCRIT: 43.3 % (ref 39.0–52.0)
Hemoglobin: 14.8 g/dL (ref 13.0–17.0)
IMMATURE GRANULOCYTES: 0 %
LYMPHS PCT: 31 %
Lymphs Abs: 1.8 10*3/uL (ref 0.7–4.0)
MCH: 28.8 pg (ref 26.0–34.0)
MCHC: 34.2 g/dL (ref 30.0–36.0)
MCV: 84.2 fL (ref 80.0–100.0)
MONO ABS: 0.8 10*3/uL (ref 0.1–1.0)
MONOS PCT: 13 %
NEUTROS ABS: 3.1 10*3/uL (ref 1.7–7.7)
NEUTROS PCT: 54 %
Platelets: 213 10*3/uL (ref 150–400)
RBC: 5.14 MIL/uL (ref 4.22–5.81)
RDW: 13.5 % (ref 11.5–15.5)
WBC: 5.8 10*3/uL (ref 4.0–10.5)
nRBC: 0 % (ref 0.0–0.2)

## 2018-05-12 LAB — TROPONIN I: Troponin I: <0.03 ng/mL (ref <0.03)

## 2018-05-12 MED ORDER — SODIUM CHLORIDE 0.9 % IV BOLUS
1000.0000 mL | Freq: Once | INTRAVENOUS | Status: AC
  Administered 2018-05-12: 1000 mL via INTRAVENOUS

## 2018-05-12 MED ORDER — POTASSIUM CHLORIDE CRYS ER 20 MEQ PO TBCR
40.0000 meq | EXTENDED_RELEASE_TABLET | Freq: Once | ORAL | Status: AC
  Administered 2018-05-12: 40 meq via ORAL
  Filled 2018-05-12: qty 2

## 2018-05-12 MED ORDER — KETOROLAC TROMETHAMINE 30 MG/ML IJ SOLN
30.0000 mg | Freq: Once | INTRAMUSCULAR | Status: AC
  Administered 2018-05-12: 30 mg via INTRAVENOUS
  Filled 2018-05-12: qty 1

## 2018-05-12 MED ORDER — HALOPERIDOL LACTATE 5 MG/ML IJ SOLN
5.0000 mg | Freq: Once | INTRAMUSCULAR | Status: AC
  Administered 2018-05-12: 5 mg via INTRAVENOUS
  Filled 2018-05-12: qty 1

## 2018-05-12 NOTE — ED Notes (Signed)
Received report, pt awaiting family to pick up, will monitor.

## 2018-05-12 NOTE — ED Notes (Addendum)
Pt's mother called, [phone], same reports will pick up pt in a hour

## 2018-05-12 NOTE — ED Provider Notes (Signed)
[facility] Emergency Department Provider Note  ____________________________________________   I have reviewed the triage vital signs and the nursing notes.   HISTORY  Chief Complaint Right shoulder/right chest pain  History limited by: Not Limited   HPI Kevin Garrett is a 43 y.o. male who presents to the emergency department today with primary concern for right sided shoulder chest pain.  He states that the shoulder pain is getting roughly 24 hours a day.  Happen at night.  He feels like his shoulder popped out of place and then popped back into place.  Since that time he is continued to have pain in his right shoulder and radiates down into his right chest.  It is worse with palpation.  It is worse with movement.  He states he had similar pain once in the past and states that it got better after breathing treatment.  He did try using some cocaine to help with the pain but is not effective.   Per medical record review patient has a history of DM, HTN  Past Medical History:  Diagnosis Date  . Anxiety   . Diabetes mellitus without complication ([place])   . Hypertension   . Nicotine dependence 10/18/2016  . Sleep apnea   . Vitamin D deficiency 10/18/2016    Patient Active Problem List   Diagnosis Date Noted  . Nicotine dependence 10/18/2016  . Vitamin D deficiency 10/18/2016  . Hypertension   . Diabetes mellitus without complication ([place])   . Sleep apnea   . Closed left scapular fracture 10/15/2016  . Closed displaced segmental fracture of shaft of left humerus 10/15/2016    Past Surgical History:  Procedure Laterality Date  . ORIF HUMERUS FRACTURE Left 10/17/2016   Procedure: OPEN REDUCTION INTERNAL FIXATION (ORIF) LEFT DISTAL HUMERUS FRACTURE;  Surgeon: Altamese [place], MD;  Location: [place];  Service: Orthopedics;  Laterality: Left;    Prior to Admission medications   Medication Sig Start Date End Date Taking? Authorizing Provider  cholecalciferol 5000  units TABS Take 2,000 Units by mouth daily. 10/20/16   Ainsley Spinner, PA-C  citalopram (CELEXA) 10 MG tablet Take 3 tablets (30 mg total) by mouth daily. 10/21/16   Ainsley Spinner, PA-C  docusate sodium (COLACE) 100 MG capsule Take 1 capsule (100 mg total) by mouth 2 (two) times daily. 10/20/16   Ainsley Spinner, PA-C  methocarbamol (ROBAXIN) 500 MG tablet Take 1-2 tablets (500-1,000 mg total) by mouth every 6 (six) hours as needed for muscle spasms. 10/20/16   Ainsley Spinner, PA-C  oxyCODONE (OXY IR/ROXICODONE) 5 MG immediate release tablet Take 1-2 tablets (5-10 mg total) by mouth every 6 (six) hours as needed for breakthrough pain (take between percocet doses fro breakthrough pain only). 10/20/16   Ainsley Spinner, PA-C  oxyCODONE-acetaminophen (PERCOCET/ROXICET) 5-325 MG tablet Take 1 tablet by mouth every 6 (six) hours as needed for severe pain. 02/01/18   Darel Hong, MD  predniSONE (DELTASONE) 10 MG tablet Take 6 tablets on day 1, take 5 tablets on day 2, take 4 tablets on day 3, take 3 tablets on day 4, take 2 tablets on day 5, take 1 tablet on day 6 10/25/16   Laban Emperor, PA-C    Allergies Patient has no known allergies.  No family history on file.  Social History Social History   Tobacco Use  . Smoking status: Current Some Day Smoker    Packs/day: 0.50    Types: Cigarettes  . Smokeless tobacco: Never Used  Substance Use  Topics  . Alcohol use: No  . Drug use: No    Review of Systems Constitutional: No fever/chills Eyes: No visual changes. ENT: No sore throat. Cardiovascular: Positive for right sided chest pain. Respiratory: Denies shortness of breath. Gastrointestinal: No abdominal pain.  No nausea, no vomiting.  No diarrhea.   Genitourinary: Negative for dysuria. Musculoskeletal: Positive for right shoulder pain. Skin: Negative for rash. Neurological: Negative for headaches, focal weakness or numbness.  ____________________________________________   PHYSICAL EXAM:  VITAL  SIGNS: ED Triage Vitals  Enc Vitals Group     BP 140/109     Pulse 66     Resp 19     Temp 97.4     Temp src      SpO2 95   Constitutional: Alert and oriented.  Eyes: Conjunctivae are normal.  ENT      Head: Normocephalic and atraumatic.      Nose: No congestion/rhinnorhea.      Mouth/Throat: Mucous membranes are moist.      Neck: No stridor. Hematological/Lymphatic/Immunilogical: No cervical lymphadenopathy. Cardiovascular: Normal rate, regular rhythm.  No murmurs, rubs, or gallops.  Respiratory: Normal respiratory effort without tachypnea nor retractions. Breath sounds are clear and equal bilaterally. No wheezes/rales/rhonchi. Gastrointestinal: Soft and non tender. No rebound. No guarding.  Genitourinary: Deferred Musculoskeletal: Normal range of motion in all extremities. No obvious deformity to right shoulder. Tender to palpation of the right chest wall.  Neurologic:  Normal speech and language. No gross focal neurologic deficits are appreciated.  Skin:  Skin is warm, dry and intact. No rash noted. Psychiatric: Mood and affect are normal. Speech and behavior are normal. Patient exhibits appropriate insight and judgment.  ____________________________________________    LABS (pertinent positives/negatives)  Trop <0.03 CBC wbc 5.8, hgb 14.8, plt 213 BMP na 138, k 3.2, glu 151, cr 1.44, ca 8.7 ____________________________________________   EKG  I, Nance Pear, attending physician, personally viewed and interpreted this EKG  EKG Time: 0248 Rate: 69 Rhythm: sinus rhythm Axis: normal Intervals: qtc 493 QRS: narrow ST changes: no st elevation, t wave inversion v5, v6 Impression: abnormal ekg   ____________________________________________    RADIOLOGY  Right shoulder x-ray No acute abnormality   ____________________________________________   PROCEDURES  Procedures  ____________________________________________   INITIAL IMPRESSION / ASSESSMENT AND  PLAN / ED COURSE  Pertinent labs & imaging results that were available during my care of the patient were reviewed by me and considered in my medical decision making (see chart for details).   Patient presented to the emergency department today because of concerns for pain in his right shoulder.  He stated it started 24 hours ago and has been constant.  He states it started when he felt a pop.  On exam there is no acute deformity although the patient does seem to be in a fair amount of pain.  In addition he states he did do crack to try to help with the pain.  While he did that after the pain started I did get a EKG and troponin.  EKG did have some T wave inversions although troponin negative.  This point I doubt ACS.  Patient did feel better after some Haldol and requested discharge home.  Will give patient RTS information.  ____________________________________________   FINAL CLINICAL IMPRESSION(S) / ED DIAGNOSES  Final diagnoses:  Acute pain of right shoulder     Note: This dictation was prepared with Dragon dictation. Any transcriptional errors that result from this process are unintentional  Nance Pear, MD 05/12/18 [phone]

## 2018-05-12 NOTE — ED Triage Notes (Signed)
Pt arrived via [facility] with right shoulder pain. Pt felt shoulder pop out of place yesterday and pain has gotten progressively worst. Pain is going down right side.  Pt reports cocaine use tonight to "help with the pain" but reports no help with the pain  Hx of HTN

## 2018-05-12 NOTE — Discharge Instructions (Addendum)
Please seek medical attention for any high fevers, chest pain, shortness of breath, change in behavior, persistent vomiting, bloody stool or any other new or concerning symptoms.  

## 2018-05-16 ENCOUNTER — Emergency Department
Admission: EM | Admit: 2018-05-16 | Discharge: 2018-05-16 | Disposition: A | Discharge status: Home / Self Care | Payer: Medicaid Other | Attending: Student in an Organized Health Care Education/Training Program | Admitting: Student in an Organized Health Care Education/Training Program

## 2018-05-16 ENCOUNTER — Encounter: Payer: Self-pay | Admitting: Emergency Medicine

## 2018-05-16 ENCOUNTER — Other Ambulatory Visit: Payer: Self-pay

## 2018-05-16 DIAGNOSIS — M19011 Primary osteoarthritis, right shoulder: Secondary | ICD-10-CM

## 2018-05-16 DIAGNOSIS — E119 Type 2 diabetes mellitus without complications: Secondary | ICD-10-CM | POA: Insufficient documentation

## 2018-05-16 DIAGNOSIS — Z7984 Long term (current) use of oral hypoglycemic drugs: Secondary | ICD-10-CM | POA: Diagnosis not present

## 2018-05-16 DIAGNOSIS — F17210 Nicotine dependence, cigarettes, uncomplicated: Secondary | ICD-10-CM | POA: Insufficient documentation

## 2018-05-16 DIAGNOSIS — I10 Essential (primary) hypertension: Secondary | ICD-10-CM | POA: Diagnosis not present

## 2018-05-16 DIAGNOSIS — M25511 Pain in right shoulder: Secondary | ICD-10-CM | POA: Diagnosis present

## 2018-05-16 DIAGNOSIS — Z79899 Other long term (current) drug therapy: Secondary | ICD-10-CM | POA: Insufficient documentation

## 2018-05-16 MED ORDER — KETOROLAC TROMETHAMINE 10 MG PO TABS: 10.0000 mg | ORAL_TABLET | Freq: Three times a day (TID) | ORAL | 0 refills | Status: DC

## 2018-05-16 MED ORDER — CYCLOBENZAPRINE HCL 5 MG PO TABS: 5.0000 mg | ORAL_TABLET | Freq: Three times a day (TID) | ORAL | 0 refills | Status: DC | PRN

## 2018-05-16 MED ORDER — ORPHENADRINE CITRATE 30 MG/ML IJ SOLN
60.0000 mg | INTRAMUSCULAR | Status: AC
  Administered 2018-05-16: 60 mg via INTRAMUSCULAR
  Filled 2018-05-16: qty 2

## 2018-05-16 MED ORDER — KETOROLAC TROMETHAMINE 30 MG/ML IJ SOLN
30.0000 mg | Freq: Once | INTRAMUSCULAR | Status: AC
  Administered 2018-05-16: 30 mg via INTRAMUSCULAR
  Filled 2018-05-16: qty 1

## 2018-05-16 NOTE — ED Notes (Addendum)
This EDT found pt in the floor of Pod D wait. When asked what happened pt stated " it feels better when I lay on the floor and no I didn't fall!" This EDT reported what was seen the to triage RN and also to both RN's in flex. Pt is now in ED RM43 In bed at this time.

## 2018-05-16 NOTE — ED Notes (Signed)

## 2018-05-16 NOTE — Discharge Instructions (Signed)
Follow-up with [facility] for further management. Take the Toradol in place of your Ibuprofen for the next 5 days. Take the Flexeril in place of your Robaxin for muscle spasms.

## 2018-05-16 NOTE — ED Provider Notes (Signed)
[facility] Emergency Department Provider Note ____________________________________________  Time seen: 1548  I have reviewed the triage vital signs and the nursing notes.  HISTORY  Chief Complaint  Shoulder Pain  HPI Kevin Garrett is a 43 y.o. male presents himself to the ED for evaluation of continued right shoulder pain.  Patient was evaluated here 4 days prior for similar injury.  He at that time had some right shoulder pain with referral into the chest.  His work-up was negative and reassuring at the time with a negative shoulder x-ray, negative troponin, and normal labs.  Patient was discharged at that time, after improvement of his symptoms, with referrals to [facility] in [place].  He returns at this time for subsequent evaluation, denying any interim injury, accident, or trauma.  He denies any distal paresthesias, chest pain, or shortness of breath.  Past Medical History:  Diagnosis Date  . Anxiety   . Diabetes mellitus without complication ([place])   . Hypertension   . Nicotine dependence 10/18/2016  . Sleep apnea   . Vitamin D deficiency 10/18/2016    Patient Active Problem List   Diagnosis Date Noted  . Nicotine dependence 10/18/2016  . Vitamin D deficiency 10/18/2016  . Hypertension   . Diabetes mellitus without complication ([place])   . Sleep apnea   . Closed left scapular fracture 10/15/2016  . Closed displaced segmental fracture of shaft of left humerus 10/15/2016    Past Surgical History:  Procedure Laterality Date  . ORIF HUMERUS FRACTURE Left 10/17/2016   Procedure: OPEN REDUCTION INTERNAL FIXATION (ORIF) LEFT DISTAL HUMERUS FRACTURE;  Surgeon: Altamese [place], MD;  Location: [place];  Service: Orthopedics;  Laterality: Left;    Prior to Admission medications   Medication Sig Start Date End Date Taking? Authorizing Provider  gabapentin (NEURONTIN) 300 MG capsule Take 300 mg by mouth 2 (two) times daily.   Yes [provider]   hydrochlorothiazide (HYDRODIURIL) 25 MG tablet Take 25 mg by mouth daily.   Yes [provider]  ibuprofen (ADVIL,MOTRIN) 600 MG tablet Take 600 mg by mouth 2 (two) times daily.   Yes [provider]  lisinopril (PRINIVIL,ZESTRIL) 20 MG tablet Take 20 mg by mouth daily.   Yes [provider]  metFORMIN (GLUCOPHAGE) 500 MG tablet Take 500 mg by mouth 2 (two) times daily with a meal.   Yes [provider]  citalopram (CELEXA) 10 MG tablet Take 3 tablets (30 mg total) by mouth daily. 10/21/16   Ainsley Spinner, PA-C  cyclobenzaprine (FLEXERIL) 5 MG tablet Take 1 tablet (5 mg total) by mouth 3 (three) times daily as needed for muscle spasms. 05/16/18   Danielle Lento, Dannielle Karvonen, PA-C  docusate sodium (COLACE) 100 MG capsule Take 1 capsule (100 mg total) by mouth 2 (two) times daily. 10/20/16   Ainsley Spinner, PA-C  ketorolac (TORADOL) 10 MG tablet Take 1 tablet (10 mg total) by mouth every 8 (eight) hours. 05/16/18   Lamees Gable, Dannielle Karvonen, PA-C    Allergies Patient has no known allergies.  History reviewed. No pertinent family history.  Social History Social History   Tobacco Use  . Smoking status: Current Some Day Smoker    Packs/day: 0.50    Types: Cigarettes  . Smokeless tobacco: Never Used  Substance Use Topics  . Alcohol use: No  . Drug use: Yes    Frequency: 2.0 times per week    Types: Cocaine    Comment: "snort and smoke"  Review of Systems  Constitutional: Negative for fever. Cardiovascular: Negative for chest pain. Respiratory: Negative for shortness of breath. Gastrointestinal: Negative for abdominal pain, vomiting and diarrhea. Genitourinary: Negative for dysuria. Musculoskeletal: Negative for back pain.  Right shoulder pain as above. Skin: Negative for rash. Neurological: Negative for headaches, focal weakness or numbness. ____________________________________________  PHYSICAL EXAM:  VITAL SIGNS: ED Triage Vitals  Enc Vitals Group      BP 05/16/18 1458 (!) 141/71     Pulse Rate 05/16/18 1458 67     Resp 05/16/18 1458 (!) 22     Temp 05/16/18 1458 97.6 F (36.4 C)     Temp Source 05/16/18 1458 Oral     SpO2 05/16/18 1458 97 %     Weight 05/16/18 1454 299 lb 13.2 oz (136 kg)     Height 05/16/18 1454 5\' 9"  (1.753 m)     Head Circumference --      Peak Flow --      Pain Score 05/16/18 1453 10     Pain Loc --      Pain Edu? --      Excl. in [place]? --     Constitutional: Alert and oriented. Well appearing and in no distress. Head: Normocephalic and atraumatic. Eyes: Conjunctivae are normal. Normal extraocular movements Cardiovascular: Normal rate, regular rhythm. Normal distal pulses. Respiratory: Normal respiratory effort. No wheezes/rales/rhonchi. Gastrointestinal: Obese, soft and nontender. No distention. Musculoskeletal: Right shoulder without obvious deformity or dislocation.  Patient noted with full active range of the right shoulder.  Nontender with normal range of motion in all extremities.  Neurologic:  Normal gait without ataxia. Normal speech and language. No gross focal neurologic deficits are appreciated. Skin:  Skin is warm, dry and intact. No rash noted. Psychiatric: Mood and affect are normal. Patient exhibits appropriate insight and judgment. ____________________________________________   RADIOLOGY  Not indicted ____________________________________________  PROCEDURES  Procedures Toradol 30 mg IM Norflex 60 mg IM ____________________________________________  INITIAL IMPRESSION / ASSESSMENT AND PLAN / ED COURSE  Patient with subsequent ED evaluation of right shoulder pain without interim injury.  Patient's exam is consistent with shoulder pain secondary to underlying osteoarthritis and DJD.  Patient was administered IM medications in the ED with improvement of her symptoms overall.  He will be discharged with prescriptions for Toradol and Flexeril to take as directed.  He is encouraged to  follow-up with primary provider for routine care or return to the ED as needed. ____________________________________________  FINAL CLINICAL IMPRESSION(S) / ED DIAGNOSES  Final diagnoses:  Acute pain of right shoulder  Primary osteoarthritis of right shoulder      Loralye Loberg, Dannielle Karvonen, PA-C 05/16/18 1828    Merlyn Lot, MD 05/16/18 [phone]

## 2018-05-16 NOTE — ED Triage Notes (Signed)
Right shoulder pain that is worse.  Was seen here recently for same.

## 2018-06-18 ENCOUNTER — Other Ambulatory Visit: Payer: Self-pay | Admitting: Acute Care

## 2018-06-18 DIAGNOSIS — R569 Unspecified convulsions: Secondary | ICD-10-CM

## 2018-07-19 ENCOUNTER — Other Ambulatory Visit: Payer: Self-pay

## 2018-07-19 ENCOUNTER — Ambulatory Visit
Admission: RE | Admit: 2018-07-19 | Discharge: 2018-07-19 | Disposition: A | Discharge status: Home / Self Care | Payer: Medicaid Other | Source: Ambulatory Visit | Attending: Acute Care | Admitting: Acute Care

## 2018-07-19 DIAGNOSIS — R569 Unspecified convulsions: Secondary | ICD-10-CM | POA: Diagnosis not present
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
## 2018-12-03 ENCOUNTER — Emergency Department
Admission: EM | Admit: 2018-12-03 | Discharge: 2018-12-03 | Disposition: A | Discharge status: Home / Self Care | Payer: Medicaid Other | Attending: Emergency Medicine | Admitting: Emergency Medicine

## 2018-12-03 ENCOUNTER — Other Ambulatory Visit: Payer: Self-pay

## 2018-12-03 ENCOUNTER — Encounter: Payer: Self-pay | Admitting: Emergency Medicine

## 2018-12-03 DIAGNOSIS — M25562 Pain in left knee: Secondary | ICD-10-CM | POA: Diagnosis not present

## 2018-12-03 DIAGNOSIS — Z7984 Long term (current) use of oral hypoglycemic drugs: Secondary | ICD-10-CM | POA: Diagnosis not present

## 2018-12-03 DIAGNOSIS — I10 Essential (primary) hypertension: Secondary | ICD-10-CM | POA: Diagnosis not present

## 2018-12-03 DIAGNOSIS — Z79899 Other long term (current) drug therapy: Secondary | ICD-10-CM | POA: Insufficient documentation

## 2018-12-03 DIAGNOSIS — H10023 Other mucopurulent conjunctivitis, bilateral: Secondary | ICD-10-CM | POA: Insufficient documentation

## 2018-12-03 DIAGNOSIS — E119 Type 2 diabetes mellitus without complications: Secondary | ICD-10-CM | POA: Diagnosis not present

## 2018-12-03 DIAGNOSIS — F17210 Nicotine dependence, cigarettes, uncomplicated: Secondary | ICD-10-CM | POA: Diagnosis not present

## 2018-12-03 MED ORDER — LIDOCAINE 5 % EX PTCH
1.0000 | MEDICATED_PATCH | CUTANEOUS | Status: DC
  Administered 2018-12-03: 1 via TRANSDERMAL
  Filled 2018-12-03: qty 1

## 2018-12-03 MED ORDER — SULFACETAMIDE SODIUM 10 % OP SOLN: 2.0000 [drp] | Freq: Four times a day (QID) | OPHTHALMIC | 0 refills | Status: AC

## 2018-12-03 MED ORDER — MELOXICAM 15 MG PO TABS: 15.0000 mg | ORAL_TABLET | Freq: Every day | ORAL | 2 refills | Status: AC

## 2018-12-03 NOTE — ED Triage Notes (Signed)
Right knee pain all weekend.  No injury

## 2018-12-03 NOTE — ED Notes (Signed)
See triage note  Presents with right knee pain  States pain started on Saturday w/o known injury    States he has been using some IBU for pain  Ambulates well to treatment room   Also thinks he may have gotten something in his right eye yesterday

## 2018-12-03 NOTE — ED Provider Notes (Signed)
[facility] Emergency Department Provider Note   ____________________________________________   First MD Initiated Contact with Patient 12/03/18 [phone]     (approximate)  I have reviewed the triage vital signs and the nursing notes.   HISTORY  Chief Complaint Knee Pain    HPI Kevin Garrett is a 43 y.o. male patient presents to ED complaining of 3 days of increasing left knee pain.  Patient states has a history of arthritis.  Patient state able to ambulate.  Patient rates pain as 8/10.  Patient described the pain is "achy".  Patient states mild relief with ibuprofen.  Patient also complained of matted right eye.  Patient state yesterday noticed yellow-greenish discharge.  Patient denies vision disturbance.         Past Medical History:  Diagnosis Date  . Anxiety   . Diabetes mellitus without complication ([place])   . Hypertension   . Nicotine dependence 10/18/2016  . Sleep apnea   . Vitamin D deficiency 10/18/2016    Patient Active Problem List   Diagnosis Date Noted  . Nicotine dependence 10/18/2016  . Vitamin D deficiency 10/18/2016  . Hypertension   . Diabetes mellitus without complication ([place])   . Sleep apnea   . Closed left scapular fracture 10/15/2016  . Closed displaced segmental fracture of shaft of left humerus 10/15/2016    Past Surgical History:  Procedure Laterality Date  . ORIF HUMERUS FRACTURE Left 10/17/2016   Procedure: OPEN REDUCTION INTERNAL FIXATION (ORIF) LEFT DISTAL HUMERUS FRACTURE;  Surgeon: Altamese [place], MD;  Location: [place];  Service: Orthopedics;  Laterality: Left;    Prior to Admission medications   Medication Sig Start Date End Date Taking? Authorizing Provider  citalopram (CELEXA) 10 MG tablet Take 3 tablets (30 mg total) by mouth daily. 10/21/16   Ainsley Spinner, PA-C  gabapentin (NEURONTIN) 300 MG capsule Take 300 mg by mouth 2 (two) times daily.    [provider]  hydrochlorothiazide (HYDRODIURIL) 25 MG  tablet Take 25 mg by mouth daily.    [provider]  lisinopril (PRINIVIL,ZESTRIL) 20 MG tablet Take 20 mg by mouth daily.    [provider]  meloxicam (MOBIC) 15 MG tablet Take 1 tablet (15 mg total) by mouth daily. 12/03/18   Sable Feil, PA-C  metFORMIN (GLUCOPHAGE) 500 MG tablet Take 500 mg by mouth 2 (two) times daily with a meal.    [provider]  sulfacetamide (BLEPH-10) 10 % ophthalmic solution Place 2 drops into both eyes 4 (four) times daily for 10 days. 12/03/18 12/13/18  Sable Feil, PA-C    Allergies Patient has no known allergies.  No family history on file.  Social History Social History   Tobacco Use  . Smoking status: Current Some Day Smoker    Packs/day: 0.50    Types: Cigarettes  . Smokeless tobacco: Never Used  Substance Use Topics  . Alcohol use: No  . Drug use: Yes    Frequency: 2.0 times per week    Types: Cocaine    Comment: "snort and smoke"    Review of Systems Constitutional: No fever/chills Eyes: No visual changes. ENT: No sore throat. Cardiovascular: Denies chest pain. Respiratory: Denies shortness of breath. Gastrointestinal: No abdominal pain.  No nausea, no vomiting.  No diarrhea.  No constipation. Genitourinary: Negative for dysuria. Musculoskeletal: Negative for back pain. Skin: Negative for rash. Neurological: Negative for headaches, focal weakness or numbness. Psychiatric:  Anxiety Endocrine:  Diabetes and hypertension  ____________________________________________  PHYSICAL EXAM:  VITAL SIGNS: ED Triage Vitals  Enc Vitals Group     BP 12/03/18 0926 (!) 170/111     Pulse Rate 12/03/18 0925 88     Resp 12/03/18 0925 17     Temp 12/03/18 0925 97.6 F (36.4 C)     Temp src --      SpO2 12/03/18 0925 97 %     Weight 12/03/18 0923 289 lb (131.1 kg)     Height 12/03/18 0923 5\' 9"  (1.753 m)     Head Circumference --      Peak Flow --      Pain Score 12/03/18 0923 8     Pain Loc --      Pain  Edu? --      Excl. in [place]? --     Constitutional: Alert and oriented. Well appearing and in no acute distress. Eyes: Conjunctivae are erythematous.  PERRL. EOMI. dry secretions bilateral eye lids Cardiovascular: Normal rate, regular rhythm. Grossly normal heart sounds.  Good peripheral circulation. Respiratory: Normal respiratory effort.  No retractions. Lungs CTAB. Gastrointestinal: Soft and nontender. No distention. No abdominal bruits. No CVA tenderness. Musculoskeletal: No obvious deformity to the right knee.  Patient has moderate guarding palpation to the anterior patella.  Moderate crepitus with palpation.   Neurologic:  Normal speech and language. No gross focal neurologic deficits are appreciated. No gait instability. Skin:  Skin is warm, dry and intact. No rash noted. Psychiatric: Mood and affect are normal. Speech and behavior are normal.  ____________________________________________   LABS (all labs ordered are listed, but only abnormal results are displayed)  Labs Reviewed - No data to display ____________________________________________  EKG   ____________________________________________  RADIOLOGY  ED MD interpretation:    Official radiology report(s): No results found.  ____________________________________________   PROCEDURES  Procedure(s) performed (including Critical Care):  Procedures   ____________________________________________   INITIAL IMPRESSION / ASSESSMENT AND PLAN / ED COURSE  As part of my medical decision making, I reviewed the following data within the [place] was evaluated in Emergency Department on 12/03/2018 for the symptoms described in the history of present illness. He was evaluated in the context of the global COVID-19 pandemic, which necessitated consideration that the patient might be at risk for infection with the SARS-CoV-2 virus that causes COVID-19. Institutional protocols and  algorithms that pertain to the evaluation of patients at risk for COVID-19 are in a state of rapid change based on information released by regulatory bodies including the CDC and federal and state organizations. These policies and algorithms were followed during the patient's care in the ED.  Patient presents with right knee pain secondary to arthritis.  Patient also has conjunctivitis.  Patient given discharge care instruction advised follow-up PCP for continued care.  Take medication as directed.      ____________________________________________   FINAL CLINICAL IMPRESSION(S) / ED DIAGNOSES  Final diagnoses:  Left knee pain, unspecified chronicity  Other mucopurulent conjunctivitis of both eyes     ED Discharge Orders         Ordered    meloxicam (MOBIC) 15 MG tablet  Daily     12/03/18 0939    sulfacetamide (BLEPH-10) 10 % ophthalmic solution  4 times daily     12/03/18 [id number]           Note:  This document was prepared using Dragon voice recognition software and may include unintentional  dictation errors.    Sable Feil, PA-C 12/03/18 [id number]    Nance Pear, MD 12/03/18 1007

## 2018-12-03 NOTE — ED Triage Notes (Signed)
PT states he has chronic issues with arthritis is his right knee but for the last week the pain "has been worse."

## 2018-12-07 ENCOUNTER — Other Ambulatory Visit: Payer: Self-pay

## 2018-12-07 ENCOUNTER — Emergency Department
Admission: EM | Admit: 2018-12-07 | Discharge: 2018-12-07 | Disposition: A | Discharge status: Home / Self Care | Payer: Medicaid Other | Attending: Emergency Medicine | Admitting: Emergency Medicine

## 2018-12-07 ENCOUNTER — Encounter: Payer: Self-pay | Admitting: Emergency Medicine

## 2018-12-07 ENCOUNTER — Emergency Department: Payer: Medicaid Other

## 2018-12-07 DIAGNOSIS — Z79899 Other long term (current) drug therapy: Secondary | ICD-10-CM | POA: Diagnosis not present

## 2018-12-07 DIAGNOSIS — Z7984 Long term (current) use of oral hypoglycemic drugs: Secondary | ICD-10-CM | POA: Diagnosis not present

## 2018-12-07 DIAGNOSIS — I10 Essential (primary) hypertension: Secondary | ICD-10-CM | POA: Diagnosis not present

## 2018-12-07 DIAGNOSIS — F17210 Nicotine dependence, cigarettes, uncomplicated: Secondary | ICD-10-CM | POA: Diagnosis not present

## 2018-12-07 DIAGNOSIS — M25561 Pain in right knee: Secondary | ICD-10-CM | POA: Diagnosis present

## 2018-12-07 DIAGNOSIS — E119 Type 2 diabetes mellitus without complications: Secondary | ICD-10-CM | POA: Insufficient documentation

## 2018-12-07 LAB — BASIC METABOLIC PANEL
Anion gap: 8 (ref 5–15)
BUN: 18 mg/dL (ref 6–20)
CO2: 28 mmol/L (ref 22–32)
Calcium: 9.1 mg/dL (ref 8.9–10.3)
Chloride: 102 mmol/L (ref 98–111)
Creatinine, Ser: 1.34 mg/dL — ABNORMAL HIGH (ref 0.61–1.24)
GFR calc Af Amer: >60 mL/min (ref >60)
GFR calc non Af Amer: >60 mL/min (ref >60)
Glucose, Bld: 164 mg/dL — ABNORMAL HIGH (ref 70–99)
Potassium: 3.4 mmol/L — ABNORMAL LOW (ref 3.5–5.1)
Sodium: 138 mmol/L (ref 135–145)

## 2018-12-07 LAB — CBC WITH DIFFERENTIAL/PLATELET
Abs Immature Granulocytes: 0.02 10*3/uL (ref 0.00–0.07)
Basophils Absolute: 0 10*3/uL (ref 0.0–0.1)
Basophils Relative: 1 %
Eosinophils Absolute: 0.1 10*3/uL (ref 0.0–0.5)
Eosinophils Relative: 1 %
HCT: 39.2 % (ref 39.0–52.0)
Hemoglobin: 13.3 g/dL (ref 13.0–17.0)
Immature Granulocytes: 0 %
Lymphocytes Relative: 44 %
Lymphs Abs: 2.7 10*3/uL (ref 0.7–4.0)
MCH: 29.4 pg (ref 26.0–34.0)
MCHC: 33.9 g/dL (ref 30.0–36.0)
MCV: 86.5 fL (ref 80.0–100.0)
Monocytes Absolute: 0.6 10*3/uL (ref 0.1–1.0)
Monocytes Relative: 10 %
Neutro Abs: 2.6 10*3/uL (ref 1.7–7.7)
Neutrophils Relative %: 44 %
Platelets: 227 10*3/uL (ref 150–400)
RBC: 4.53 MIL/uL (ref 4.22–5.81)
RDW: 13.5 % (ref 11.5–15.5)
WBC: 6 10*3/uL (ref 4.0–10.5)
nRBC: 0 % (ref 0.0–0.2)

## 2018-12-07 LAB — SEDIMENTATION RATE: Sed Rate: 17 mm/hr — ABNORMAL HIGH (ref 0–15)

## 2018-12-07 LAB — URIC ACID: Uric Acid, Serum: 8.4 mg/dL (ref 3.7–8.6)

## 2018-12-07 MED ORDER — TRAMADOL HCL 50 MG PO TABS: 50.0000 mg | ORAL_TABLET | Freq: Two times a day (BID) | ORAL | 0 refills | Status: DC | PRN

## 2018-12-07 MED ORDER — KETOROLAC TROMETHAMINE 30 MG/ML IJ SOLN
30.0000 mg | Freq: Once | INTRAMUSCULAR | Status: AC
  Administered 2018-12-07: 12:00:00 30 mg via INTRAVENOUS
  Filled 2018-12-07: qty 1

## 2018-12-07 NOTE — ED Triage Notes (Signed)
R knee pain x 1 week, denies fall or injury at onset.

## 2018-12-07 NOTE — ED Notes (Signed)
Pt c/o right knee pain x1 week. Pt reports hx of gout and now being unable to walk on his right leg.

## 2018-12-07 NOTE — Discharge Instructions (Addendum)
Wear knee support and ambulate with crutches.  Follow-up with PCP.

## 2018-12-07 NOTE — ED Provider Notes (Signed)
[facility] Emergency Department Provider Note   ____________________________________________   First MD Initiated Contact with Patient 12/07/18 1201     (approximate)  I have reviewed the triage vital signs and the nursing notes.   HISTORY  Chief Complaint Knee Pain    HPI Kevin Garrett is a 43 y.o. male patient complain of right knee pain for 1 week.  Patient was seen earlier this week for same complaint.  Patient stated no relief with anti-inflammatory medications.  Patient state pain increased with weightbearing.  Patient rates pain as a 10/10.  Patient chronic pain is "achy".         Past Medical History:  Diagnosis Date  . Anxiety   . Diabetes mellitus without complication ([place])   . Hypertension   . Nicotine dependence 10/18/2016  . Sleep apnea   . Vitamin D deficiency 10/18/2016    Patient Active Problem List   Diagnosis Date Noted  . Nicotine dependence 10/18/2016  . Vitamin D deficiency 10/18/2016  . Hypertension   . Diabetes mellitus without complication ([place])   . Sleep apnea   . Closed left scapular fracture 10/15/2016  . Closed displaced segmental fracture of shaft of left humerus 10/15/2016    Past Surgical History:  Procedure Laterality Date  . ORIF HUMERUS FRACTURE Left 10/17/2016   Procedure: OPEN REDUCTION INTERNAL FIXATION (ORIF) LEFT DISTAL HUMERUS FRACTURE;  Surgeon: Altamese [place], MD;  Location: [place];  Service: Orthopedics;  Laterality: Left;    Prior to Admission medications   Medication Sig Start Date End Date Taking? Authorizing Provider  citalopram (CELEXA) 10 MG tablet Take 3 tablets (30 mg total) by mouth daily. 10/21/16   Ainsley Spinner, PA-C  gabapentin (NEURONTIN) 300 MG capsule Take 300 mg by mouth 2 (two) times daily.    [provider]  hydrochlorothiazide (HYDRODIURIL) 25 MG tablet Take 25 mg by mouth daily.    [provider]  lisinopril (PRINIVIL,ZESTRIL) 20 MG tablet Take 20 mg by mouth  daily.    [provider]  meloxicam (MOBIC) 15 MG tablet Take 1 tablet (15 mg total) by mouth daily. 12/03/18   Sable Feil, PA-C  metFORMIN (GLUCOPHAGE) 500 MG tablet Take 500 mg by mouth 2 (two) times daily with a meal.    [provider]  sulfacetamide (BLEPH-10) 10 % ophthalmic solution Place 2 drops into both eyes 4 (four) times daily for 10 days. 12/03/18 12/13/18  Sable Feil, PA-C  traMADol (ULTRAM) 50 MG tablet Take 1 tablet (50 mg total) by mouth every 12 (twelve) hours as needed. 12/07/18   Sable Feil, PA-C    Allergies Patient has no known allergies.  No family history on file.  Social History Social History   Tobacco Use  . Smoking status: Current Some Day Smoker    Packs/day: 0.50    Types: Cigarettes  . Smokeless tobacco: Never Used  Substance Use Topics  . Alcohol use: No  . Drug use: Yes    Frequency: 2.0 times per week    Types: Cocaine    Comment: "snort and smoke"    Review of Systems  Constitutional: No fever/chills Eyes: No visual changes. ENT: No sore throat. Cardiovascular: Denies chest pain. Respiratory: Denies shortness of breath. Gastrointestinal: No abdominal pain.  No nausea, no vomiting.  No diarrhea.  No constipation. Genitourinary: Negative for dysuria. Musculoskeletal: Right knee pain. Skin: Negative for rash. Neurological: Negative for headaches, focal weakness or numbness. Psychiatric: Anxiety  Endocrine:  Diabetes and hypertension. ____________________________________________   PHYSICAL EXAM:  VITAL SIGNS: ED Triage Vitals  Enc Vitals Group     BP 12/07/18 1159 (!) 159/92     Pulse Rate 12/07/18 1159 69     Resp 12/07/18 1159 18     Temp 12/07/18 1159 97.9 F (36.6 C)     Temp Source 12/07/18 1159 Oral     SpO2 12/07/18 1159 96 %     Weight 12/07/18 1156 270 lb (122.5 kg)     Height 12/07/18 1156 5\' 9"  (1.753 m)     Head Circumference --      Peak Flow --      Pain Score 12/07/18 1156 10      Pain Loc --      Pain Edu? --      Excl. in [place]? --    Constitutional: Alert and oriented.  Moderate distress.   Cardiovascular: Normal rate, regular rhythm. Grossly normal heart sounds.  Good peripheral circulation. Respiratory: Normal respiratory effort.  No retractions. Lungs CTAB. Musculoskeletal: No obvious deformity to right knee.  Patient has moderate guarding palpation anterior patella.  Patient refused to weight-bear secondary to complaint of pain.  Neurologic:  Normal speech and language. No gross focal neurologic deficits are appreciated. No gait instability. Skin:  Skin is warm, dry and intact. No rash noted. Psychiatric: Mood and affect are normal. Speech and behavior are normal.  ____________________________________________   LABS (all labs ordered are listed, but only abnormal results are displayed)  Labs Reviewed  BASIC METABOLIC PANEL - Abnormal; Notable for the following components:      Result Value   Potassium 3.4 (*)    Glucose, Bld 164 (*)    Creatinine, Ser 1.34 (*)    All other components within normal limits  SEDIMENTATION RATE - Abnormal; Notable for the following components:   Sed Rate 17 (*)    All other components within normal limits  CBC WITH DIFFERENTIAL/PLATELET  URIC ACID   ____________________________________________  EKG   ____________________________________________  RADIOLOGY  ED MD interpretation:    Official radiology report(s): Dg Knee Complete 4 Views Right  Result Date: 12/07/2018 CLINICAL DATA:  Acute right knee pain without known injury. EXAM: RIGHT KNEE - COMPLETE 4+ VIEW COMPARISON:  None. FINDINGS: No evidence of fracture, dislocation, or joint effusion. No evidence of arthropathy. Moderate patellar spurring is noted. Exophytic bony protuberance is seen arising posteriorly from the proximal tibia which may represent osteochondroma. Soft tissues are unremarkable. 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 Patient is symptomatic in this area, further evaluation with MRI is recommended to rule out possible malignant transformation, although this is rare. No fracture or dislocation is noted. Electronically Signed   By: Marijo Conception M.D.   On: 12/07/2018 12:52    ____________________________________________   PROCEDURES  Procedure(s) performed (including Critical Care):  Procedures   ____________________________________________   INITIAL IMPRESSION / ASSESSMENT AND PLAN / ED COURSE  As part of my medical decision making, I reviewed the following data within the [place] was evaluated in Emergency Department on 12/07/2018 for the symptoms described in the history of present illness. He was evaluated in the context of the global COVID-19 pandemic, which necessitated consideration that the patient might be at risk for infection with the SARS-CoV-2 virus that causes COVID-19. Institutional protocols and algorithms that pertain to the evaluation of patients at risk for COVID-19 are in a  state of rapid change based on information released by regulatory bodies including the CDC and federal and state organizations. These policies and algorithms were followed during the patient's care in the ED.  Patient presents with continued atraumatic right knee pain secondary to degenerative changes of the patella.  Discussed lab and x-ray findings with patient.  Patient responded well to IV Toradol.  Patient placed in a knee immobilizer and given crutches for support and ambulation.  Patient advised to continue previous medications.  Patient given a prescription for tramadol and advised to follow-up with community health clinic for continued care.     ____________________________________________   FINAL CLINICAL IMPRESSION(S) / ED DIAGNOSES  Final diagnoses:  Acute pain of right knee     ED Discharge Orders         Ordered    traMADol  (ULTRAM) 50 MG tablet  Every 12 hours PRN     12/07/18 1325           Note:  This document was prepared using Dragon voice recognition software and may include unintentional dictation errors.    Sable Feil, PA-C 12/07/18 1328    Vanessa Weldon [place], MD 12/08/18 [phone]

## 2018-12-23 ENCOUNTER — Other Ambulatory Visit: Payer: Self-pay | Admitting: Family Medicine

## 2019-01-01 ENCOUNTER — Ambulatory Visit: Payer: Medicaid Other | Attending: Neurology

## 2019-01-01 DIAGNOSIS — F513 Sleepwalking [somnambulism]: Secondary | ICD-10-CM | POA: Diagnosis present

## 2019-01-01 DIAGNOSIS — G4733 Obstructive sleep apnea (adult) (pediatric): Secondary | ICD-10-CM | POA: Diagnosis not present

## 2019-01-02 ENCOUNTER — Other Ambulatory Visit: Payer: Self-pay

## 2019-01-28 ENCOUNTER — Other Ambulatory Visit: Payer: Self-pay

## 2019-01-28 DIAGNOSIS — Z7984 Long term (current) use of oral hypoglycemic drugs: Secondary | ICD-10-CM | POA: Insufficient documentation

## 2019-01-28 DIAGNOSIS — I10 Essential (primary) hypertension: Secondary | ICD-10-CM | POA: Diagnosis not present

## 2019-01-28 DIAGNOSIS — Z79899 Other long term (current) drug therapy: Secondary | ICD-10-CM | POA: Diagnosis not present

## 2019-01-28 DIAGNOSIS — D169 Benign neoplasm of bone and articular cartilage, unspecified: Secondary | ICD-10-CM | POA: Insufficient documentation

## 2019-01-28 DIAGNOSIS — M25562 Pain in left knee: Secondary | ICD-10-CM | POA: Diagnosis present

## 2019-01-28 DIAGNOSIS — E119 Type 2 diabetes mellitus without complications: Secondary | ICD-10-CM | POA: Insufficient documentation

## 2019-01-28 DIAGNOSIS — F17210 Nicotine dependence, cigarettes, uncomplicated: Secondary | ICD-10-CM | POA: Diagnosis not present

## 2019-01-28 NOTE — ED Triage Notes (Signed)
Patient to ER for c/o left sided knee pain x2-3 days. Patient denies any known injury. Patient reports h/o arthritis in other knee.

## 2019-01-29 ENCOUNTER — Emergency Department: Payer: Medicaid Other

## 2019-01-29 ENCOUNTER — Encounter: Payer: Self-pay | Admitting: Emergency Medicine

## 2019-01-29 ENCOUNTER — Emergency Department
Admission: EM | Admit: 2019-01-29 | Discharge: 2019-01-29 | Disposition: A | Discharge status: Home / Self Care | Payer: Medicaid Other | Attending: Student | Admitting: Student

## 2019-01-29 DIAGNOSIS — M25562 Pain in left knee: Secondary | ICD-10-CM

## 2019-01-29 DIAGNOSIS — D169 Benign neoplasm of bone and articular cartilage, unspecified: Secondary | ICD-10-CM

## 2019-01-29 MED ORDER — NAPROXEN 500 MG PO TABS: 500.0000 mg | ORAL_TABLET | Freq: Two times a day (BID) | ORAL | 0 refills | Status: AC

## 2019-01-29 MED ORDER — KETOROLAC TROMETHAMINE 30 MG/ML IJ SOLN
30.0000 mg | Freq: Once | INTRAMUSCULAR | Status: AC
  Administered 2019-01-29: 30 mg via INTRAMUSCULAR
  Filled 2019-01-29: qty 1

## 2019-01-29 NOTE — Discharge Instructions (Signed)
Thank you for letting us take care of you in the emergency department today.   Please continue to take any regular, prescribed medications.   New medications we have prescribed:  - Naproxen. Please stay well hydrated when on this medication to avoid kidney injury.  Please follow up with: - Orthopedic doctors, as below  Please return to the ER for any new or worsening symptoms.

## 2019-01-29 NOTE — ED Provider Notes (Signed)
[facility] Emergency Department Provider Note  ____________________________________________   First MD Initiated Contact with Patient 01/29/19 0310     (approximate)  I have reviewed the triage vital signs and the nursing notes.  History  Chief Complaint Knee Pain    HPI Kevin Garrett is a 43 y.o. male with history of arthritis who presents to the ED for left knee pain. Pain has been present x 2-3 days. Constant. No preceding trauma or injury. He has a history of arthritis in his R knee. States 10/10 in severity, aching in quality. No radiation. Worse with walking. Has not tried anything for the pain. He reports some mild associated swelling. No warmth, erythema, fevers, nausea, vomiting. No hx of gout or infected joint.    Past Medical Hx Past Medical History:  Diagnosis Date  . Anxiety   . Diabetes mellitus without complication ([place])   . Hypertension   . Nicotine dependence 10/18/2016  . Sleep apnea   . Vitamin D deficiency 10/18/2016    Problem List Patient Active Problem List   Diagnosis Date Noted  . Nicotine dependence 10/18/2016  . Vitamin D deficiency 10/18/2016  . Hypertension   . Diabetes mellitus without complication ([place])   . Sleep apnea   . Closed left scapular fracture 10/15/2016  . Closed displaced segmental fracture of shaft of left humerus 10/15/2016    Past Surgical Hx Past Surgical History:  Procedure Laterality Date  . ORIF HUMERUS FRACTURE Left 10/17/2016   Procedure: OPEN REDUCTION INTERNAL FIXATION (ORIF) LEFT DISTAL HUMERUS FRACTURE;  Surgeon: Altamese , MD;  Location: [place];  Service: Orthopedics;  Laterality: Left;    Medications Prior to Admission medications   Medication Sig Start Date End Date Taking? Authorizing Provider  citalopram (CELEXA) 10 MG tablet Take 3 tablets (30 mg total) by mouth daily. 10/21/16   Ainsley Spinner, PA-C  gabapentin (NEURONTIN) 300 MG capsule Take 300 mg by mouth 2 (two) times daily.     [provider]  hydrochlorothiazide (HYDRODIURIL) 25 MG tablet Take 25 mg by mouth daily.    [provider]  lisinopril (PRINIVIL,ZESTRIL) 20 MG tablet Take 20 mg by mouth daily.    [provider]  meloxicam (MOBIC) 15 MG tablet Take 1 tablet (15 mg total) by mouth daily. 12/03/18   Sable Feil, PA-C  metFORMIN (GLUCOPHAGE) 500 MG tablet Take 500 mg by mouth 2 (two) times daily with a meal.    [provider]  traMADol (ULTRAM) 50 MG tablet Take 1 tablet (50 mg total) by mouth every 12 (twelve) hours as needed. 12/07/18   Sable Feil, PA-C    Allergies Patient has no known allergies.  Family Hx No family history on file.  Social Hx Social History   Tobacco Use  . Smoking status: Current Some Day Smoker    Packs/day: 0.50    Types: Cigarettes  . Smokeless tobacco: Never Used  Substance Use Topics  . Alcohol use: No  . Drug use: Yes    Frequency: 2.0 times per week    Types: Cocaine    Comment: "snort and smoke"     Review of Systems  Constitutional: Negative for fever, chills. Eyes: Negative for visual changes. ENT: Negative for sore throat. Cardiovascular: Negative for chest pain. Respiratory: Negative for shortness of breath. Gastrointestinal: Negative for nausea, vomiting.  Genitourinary: Negative for dysuria. Musculoskeletal: + knee pain. Skin: Negative for rash. Neurological: Negative for for headaches.   Physical Exam  Vital Signs: ED Triage Vitals  Enc Vitals Group     BP 01/28/19 2359 (!) 157/95     Pulse Rate 01/28/19 2359 85     Resp 01/28/19 2359 20     Temp 01/28/19 2359 98.2 F (36.8 C)     Temp Source 01/28/19 2359 Oral     SpO2 01/28/19 2359 98 %     Weight 01/28/19 2359 290 lb (131.5 kg)     Height 01/28/19 2359 5\' 9"  (1.753 m)     Head Circumference --      Peak Flow --      Pain Score 01/29/19 0002 10     Pain Loc --      Pain Edu? --      Excl. in [place]? --     Constitutional: Alert and  oriented.  Head: Normocephalic. Atraumatic. Eyes: Conjunctivae clear. Sclera anicteric. Nose: No congestion. No rhinorrhea. Mouth/Throat: Wearing mask.  Neck: No stridor.   Cardiovascular: Normal rate, regular rhythm. Extremities well perfused. 2+ DP pulses. Toes WWP. Respiratory: Normal respiratory effort.  Musculoskeletal: Minimal swelling about the LEFT knee. No warmth or erythema. NT about the medial and lateral joint line. Able to range, flex and extend at knee without significant discomfort. Able to straight leg raise.  Neurologic:  Normal speech and language. No gross focal neurologic deficits are appreciated.  Skin: Skin is warm, dry and intact. No rash noted. Psychiatric: Mood and affect are appropriate for situation.  EKG  N/A    Radiology  XR: IMPRESSION:  1. No acute osseous abnormality or joint effusion.  2. Quadriceps and patellar tendon enthesophytes.  3. Osseous excrescence from the posterolateral proximal tibia measures approximately 16 x 12 mm, likely osteochondroma. If there is focal pain in this region, consider further evaluation with MRI on an elective basis.    Procedures  Procedure(s) performed (including critical care):  Procedures   Initial Impression / Assessment and Plan / ED Course  43 y.o. male who presents to the ED for left knee pain, as above.  Suspect likely arthritis type pain based on exam and history. Presentation not consistent with gout or infected joint. XR w/o acute abnormality, though possible osteochondroma of the posterolateral proximal tibia. Discussed findings with patient, given referral for orthopedics for follow up. Ace wrap for support, pain control. Plan for Toradol here then short course naproxen for symptom control. Advised outpatient follow up. Patient agreeable.      Final Clinical Impression(s) / ED Diagnosis  Final diagnoses:  Acute pain of left knee  Osteochondroma       Note:  This document was prepared  using Dragon voice recognition software and may include unintentional dictation errors.   Lilia Pro., MD 01/29/19 [phone]

## 2019-04-01 ENCOUNTER — Other Ambulatory Visit: Payer: Self-pay

## 2019-04-01 ENCOUNTER — Other Ambulatory Visit
Admission: RE | Admit: 2019-04-01 | Discharge: 2019-04-01 | Disposition: A | Discharge status: Home / Self Care | Payer: Medicaid Other | Source: Ambulatory Visit | Attending: Neurology | Admitting: Neurology

## 2019-04-01 DIAGNOSIS — Z01812 Encounter for preprocedural laboratory examination: Secondary | ICD-10-CM | POA: Diagnosis present

## 2019-04-01 DIAGNOSIS — Z20822 Contact with and (suspected) exposure to covid-19: Secondary | ICD-10-CM | POA: Insufficient documentation

## 2019-04-01 LAB — SARS CORONAVIRUS 2 (TAT 6-24 HRS): SARS Coronavirus 2: NEGATIVE

## 2019-04-03 ENCOUNTER — Ambulatory Visit: Payer: Medicaid Other | Attending: Neurology

## 2019-04-03 DIAGNOSIS — G4733 Obstructive sleep apnea (adult) (pediatric): Secondary | ICD-10-CM | POA: Insufficient documentation

## 2019-04-07 ENCOUNTER — Other Ambulatory Visit: Payer: Self-pay

## 2020-02-18 ENCOUNTER — Emergency Department
Admission: EM | Admit: 2020-02-18 | Discharge: 2020-02-18 | Disposition: A | Discharge status: Home / Self Care | Payer: Medicare Other | Attending: Emergency Medicine | Admitting: Emergency Medicine

## 2020-02-18 ENCOUNTER — Other Ambulatory Visit: Payer: Self-pay

## 2020-02-18 ENCOUNTER — Encounter: Payer: Self-pay | Admitting: Intensive Care

## 2020-02-18 DIAGNOSIS — R55 Syncope and collapse: Secondary | ICD-10-CM | POA: Insufficient documentation

## 2020-02-18 DIAGNOSIS — Z5321 Procedure and treatment not carried out due to patient leaving prior to being seen by health care provider: Secondary | ICD-10-CM | POA: Diagnosis not present

## 2020-02-18 LAB — CBC
HCT: 36.2 % — ABNORMAL LOW (ref 39.0–52.0)
Hemoglobin: 12.3 g/dL — ABNORMAL LOW (ref 13.0–17.0)
MCH: 28.7 pg (ref 26.0–34.0)
MCHC: 34 g/dL (ref 30.0–36.0)
MCV: 84.4 fL (ref 80.0–100.0)
Platelets: 240 10*3/uL (ref 150–400)
RBC: 4.29 MIL/uL (ref 4.22–5.81)
RDW: 14.7 % (ref 11.5–15.5)
WBC: 8.7 10*3/uL (ref 4.0–10.5)
nRBC: 0 % (ref 0.0–0.2)

## 2020-02-18 LAB — BASIC METABOLIC PANEL
Anion gap: 9 (ref 5–15)
BUN: 19 mg/dL (ref 6–20)
CO2: 32 mmol/L (ref 22–32)
Calcium: 8.8 mg/dL — ABNORMAL LOW (ref 8.9–10.3)
Chloride: 97 mmol/L — ABNORMAL LOW (ref 98–111)
Creatinine, Ser: 1.36 mg/dL — ABNORMAL HIGH (ref 0.61–1.24)
GFR, Estimated: >60 mL/min (ref >60)
Glucose, Bld: 117 mg/dL — ABNORMAL HIGH (ref 70–99)
Potassium: 3.2 mmol/L — ABNORMAL LOW (ref 3.5–5.1)
Sodium: 138 mmol/L (ref 135–145)

## 2020-02-18 NOTE — ED Triage Notes (Signed)
First RN note: pt to ED via [facility] with c/o near syncope. Per EMS pt was cooking lunch when he almost fainted. Per EMS pt was laying on the floor when EMS arrived. Per EMS supine BP 93/53 -> 114/57. Per EMS NSR on the 12L, 20G to L AC 350cc's NS given en route, CBG 186.

## 2020-02-18 NOTE — ED Triage Notes (Signed)
Patient arrived by EMS for hypotension. Pt c/o being tired and weak. Reports near syncope.

## 2020-02-20 ENCOUNTER — Telehealth: Payer: Self-pay | Admitting: Emergency Medicine

## 2020-02-20 NOTE — Telephone Encounter (Signed)
Called patient due to left emergency department before provider exam to inquire about condition and follow up plans. Left message. 

## 2022-03-01 ENCOUNTER — Encounter: Payer: Self-pay | Admitting: Emergency Medicine

## 2022-03-01 ENCOUNTER — Other Ambulatory Visit: Payer: Self-pay

## 2022-03-01 ENCOUNTER — Emergency Department: Payer: Medicare Other

## 2022-03-01 ENCOUNTER — Inpatient Hospital Stay: Payer: Medicare Other

## 2022-03-01 ENCOUNTER — Inpatient Hospital Stay
Admission: EM | Admit: 2022-03-01 | Discharge: 2022-03-04 | DRG: 070 | Disposition: A | Discharge status: Home / Self Care | Payer: Medicare Other | Attending: Internal Medicine | Admitting: Internal Medicine

## 2022-03-01 DIAGNOSIS — R4182 Altered mental status, unspecified: Secondary | ICD-10-CM | POA: Diagnosis present

## 2022-03-01 DIAGNOSIS — G4730 Sleep apnea, unspecified: Secondary | ICD-10-CM | POA: Diagnosis present

## 2022-03-01 DIAGNOSIS — Z1152 Encounter for screening for COVID-19: Secondary | ICD-10-CM | POA: Diagnosis not present

## 2022-03-01 DIAGNOSIS — I160 Hypertensive urgency: Secondary | ICD-10-CM | POA: Diagnosis present

## 2022-03-01 DIAGNOSIS — F17210 Nicotine dependence, cigarettes, uncomplicated: Secondary | ICD-10-CM | POA: Diagnosis present

## 2022-03-01 DIAGNOSIS — Z79899 Other long term (current) drug therapy: Secondary | ICD-10-CM | POA: Diagnosis not present

## 2022-03-01 DIAGNOSIS — I5031 Acute diastolic (congestive) heart failure: Secondary | ICD-10-CM | POA: Diagnosis present

## 2022-03-01 DIAGNOSIS — E119 Type 2 diabetes mellitus without complications: Secondary | ICD-10-CM | POA: Diagnosis present

## 2022-03-01 DIAGNOSIS — G9341 Metabolic encephalopathy: Principal | ICD-10-CM | POA: Diagnosis present

## 2022-03-01 DIAGNOSIS — F1410 Cocaine abuse, uncomplicated: Secondary | ICD-10-CM | POA: Diagnosis present

## 2022-03-01 DIAGNOSIS — Z91148 Patient's other noncompliance with medication regimen for other reason: Secondary | ICD-10-CM

## 2022-03-01 DIAGNOSIS — F32A Depression, unspecified: Secondary | ICD-10-CM | POA: Diagnosis present

## 2022-03-01 DIAGNOSIS — Z6841 Body Mass Index (BMI) 40.0 and over, adult: Secondary | ICD-10-CM | POA: Diagnosis not present

## 2022-03-01 DIAGNOSIS — Z7984 Long term (current) use of oral hypoglycemic drugs: Secondary | ICD-10-CM

## 2022-03-01 DIAGNOSIS — F17200 Nicotine dependence, unspecified, uncomplicated: Secondary | ICD-10-CM | POA: Diagnosis present

## 2022-03-01 DIAGNOSIS — Z791 Long term (current) use of non-steroidal anti-inflammatories (NSAID): Secondary | ICD-10-CM | POA: Diagnosis not present

## 2022-03-01 DIAGNOSIS — R410 Disorientation, unspecified: Secondary | ICD-10-CM | POA: Diagnosis not present

## 2022-03-01 DIAGNOSIS — E66813 Obesity, class 3: Secondary | ICD-10-CM | POA: Diagnosis present

## 2022-03-01 DIAGNOSIS — G2581 Restless legs syndrome: Secondary | ICD-10-CM | POA: Diagnosis present

## 2022-03-01 DIAGNOSIS — G4733 Obstructive sleep apnea (adult) (pediatric): Secondary | ICD-10-CM | POA: Diagnosis present

## 2022-03-01 DIAGNOSIS — G4719 Other hypersomnia: Secondary | ICD-10-CM | POA: Diagnosis not present

## 2022-03-01 DIAGNOSIS — Z91199 Patient's noncompliance with other medical treatment and regimen due to unspecified reason: Secondary | ICD-10-CM | POA: Diagnosis not present

## 2022-03-01 DIAGNOSIS — I110 Hypertensive heart disease with heart failure: Secondary | ICD-10-CM | POA: Diagnosis present

## 2022-03-01 DIAGNOSIS — Z72820 Sleep deprivation: Secondary | ICD-10-CM | POA: Diagnosis not present

## 2022-03-01 DIAGNOSIS — E1165 Type 2 diabetes mellitus with hyperglycemia: Secondary | ICD-10-CM | POA: Diagnosis present

## 2022-03-01 LAB — URINE DRUG SCREEN, QUALITATIVE (ARMC ONLY)
Amphetamines, Ur Screen: NOT DETECTED
Barbiturates, Ur Screen: NOT DETECTED
Benzodiazepine, Ur Scrn: NOT DETECTED
Cannabinoid 50 Ng, Ur ~~LOC~~: NOT DETECTED
Cocaine Metabolite,Ur ~~LOC~~: POSITIVE — AB
MDMA (Ecstasy)Ur Screen: NOT DETECTED
Methadone Scn, Ur: NOT DETECTED
Opiate, Ur Screen: NOT DETECTED
Phencyclidine (PCP) Ur S: NOT DETECTED
Tricyclic, Ur Screen: NOT DETECTED

## 2022-03-01 LAB — LIPASE, BLOOD: Lipase: 30 U/L (ref 11–51)

## 2022-03-01 LAB — BLOOD GAS, VENOUS
Acid-Base Excess: 11.2 mmol/L — ABNORMAL HIGH (ref 0.0–2.0)
Bicarbonate: 37.8 mmol/L — ABNORMAL HIGH (ref 20.0–28.0)
O2 Saturation: 58.1 %
Patient temperature: 37
pCO2, Ven: 57 mmHg (ref 44–60)
pH, Ven: 7.43 (ref 7.25–7.43)
pO2, Ven: 44 mmHg (ref 32–45)

## 2022-03-01 LAB — COMPREHENSIVE METABOLIC PANEL
ALT: 28 U/L (ref 0–44)
AST: 24 U/L (ref 15–41)
Albumin: 4.1 g/dL (ref 3.5–5.0)
Alkaline Phosphatase: 61 U/L (ref 38–126)
Anion gap: 4 — ABNORMAL LOW (ref 5–15)
BUN: 15 mg/dL (ref 6–20)
CO2: 29 mmol/L (ref 22–32)
Calcium: 9 mg/dL (ref 8.9–10.3)
Chloride: 104 mmol/L (ref 98–111)
Creatinine, Ser: 0.92 mg/dL (ref 0.61–1.24)
GFR, Estimated: >60 mL/min (ref >60)
Glucose, Bld: 240 mg/dL — ABNORMAL HIGH (ref 70–99)
Potassium: 3.5 mmol/L (ref 3.5–5.1)
Sodium: 137 mmol/L (ref 135–145)
Total Bilirubin: 0.9 mg/dL (ref 0.3–1.2)
Total Protein: 8.3 g/dL — ABNORMAL HIGH (ref 6.5–8.1)

## 2022-03-01 LAB — CBC WITH DIFFERENTIAL/PLATELET
Abs Immature Granulocytes: 0.03 10*3/uL (ref 0.00–0.07)
Basophils Absolute: 0.1 10*3/uL (ref 0.0–0.1)
Basophils Relative: 1 %
Eosinophils Absolute: 0.1 10*3/uL (ref 0.0–0.5)
Eosinophils Relative: 1 %
HCT: 44.9 % (ref 39.0–52.0)
Hemoglobin: 15.1 g/dL (ref 13.0–17.0)
Immature Granulocytes: 0 %
Lymphocytes Relative: 27 %
Lymphs Abs: 1.9 10*3/uL (ref 0.7–4.0)
MCH: 28.5 pg (ref 26.0–34.0)
MCHC: 33.6 g/dL (ref 30.0–36.0)
MCV: 84.9 fL (ref 80.0–100.0)
Monocytes Absolute: 0.5 10*3/uL (ref 0.1–1.0)
Monocytes Relative: 8 %
Neutro Abs: 4.6 10*3/uL (ref 1.7–7.7)
Neutrophils Relative %: 63 %
Platelets: 198 10*3/uL (ref 150–400)
RBC: 5.29 MIL/uL (ref 4.22–5.81)
RDW: 13.7 % (ref 11.5–15.5)
WBC: 7.2 10*3/uL (ref 4.0–10.5)
nRBC: 0 % (ref 0.0–0.2)

## 2022-03-01 LAB — CBG MONITORING, ED
Glucose-Capillary: 279 mg/dL — ABNORMAL HIGH (ref 70–99)
Glucose-Capillary: 329 mg/dL — ABNORMAL HIGH (ref 70–99)

## 2022-03-01 LAB — ETHANOL: Alcohol, Ethyl (B): <10 mg/dL (ref <10)

## 2022-03-01 LAB — AMMONIA: Ammonia: 41 umol/L — ABNORMAL HIGH (ref 9–35)

## 2022-03-01 LAB — TROPONIN I (HIGH SENSITIVITY)
Troponin I (High Sensitivity): 38 ng/L — ABNORMAL HIGH (ref <18)
Troponin I (High Sensitivity): 36 ng/L — ABNORMAL HIGH (ref <18)

## 2022-03-01 LAB — BRAIN NATRIURETIC PEPTIDE: B Natriuretic Peptide: 123.3 pg/mL — ABNORMAL HIGH (ref 0.0–100.0)

## 2022-03-01 LAB — RESP PANEL BY RT-PCR (RSV, FLU A&B, COVID)  RVPGX2
Influenza A by PCR: NEGATIVE
Influenza B by PCR: NEGATIVE
Resp Syncytial Virus by PCR: NEGATIVE
SARS Coronavirus 2 by RT PCR: NEGATIVE

## 2022-03-01 LAB — PROCALCITONIN: Procalcitonin: <0.1 ng/mL

## 2022-03-01 MED ORDER — CITALOPRAM HYDROBROMIDE 20 MG PO TABS: 30.0000 mg | ORAL_TABLET | Freq: Every day | ORAL | Status: DC

## 2022-03-01 MED ORDER — METHYLPREDNISOLONE SODIUM SUCC 125 MG IJ SOLR
60.0000 mg | Freq: Once | INTRAMUSCULAR | Status: AC
  Administered 2022-03-01: 60 mg via INTRAVENOUS
  Filled 2022-03-01: qty 2

## 2022-03-01 MED ORDER — IOHEXOL 350 MG/ML SOLN
75.0000 mL | Freq: Once | INTRAVENOUS | Status: AC | PRN
  Administered 2022-03-01: 75 mL via INTRAVENOUS

## 2022-03-01 MED ORDER — ONDANSETRON HCL 4 MG/2ML IJ SOLN: 4.0000 mg | Freq: Four times a day (QID) | INTRAMUSCULAR | Status: DC | PRN

## 2022-03-01 MED ORDER — ACETAMINOPHEN 650 MG RE SUPP: 650.0000 mg | Freq: Four times a day (QID) | RECTAL | Status: DC | PRN

## 2022-03-01 MED ORDER — FUROSEMIDE 10 MG/ML IJ SOLN
40.0000 mg | Freq: Once | INTRAMUSCULAR | Status: AC
  Administered 2022-03-01: 40 mg via INTRAVENOUS
  Filled 2022-03-01: qty 4

## 2022-03-01 MED ORDER — ACETAMINOPHEN 325 MG PO TABS: 650.0000 mg | ORAL_TABLET | Freq: Four times a day (QID) | ORAL | Status: DC | PRN

## 2022-03-01 MED ORDER — GABAPENTIN 300 MG PO CAPS
300.0000 mg | ORAL_CAPSULE | Freq: Two times a day (BID) | ORAL | Status: DC
  Administered 2022-03-02 (×2): 300 mg via ORAL
  Filled 2022-03-01 (×2): qty 1

## 2022-03-01 MED ORDER — INSULIN ASPART 100 UNIT/ML IJ SOLN
0.0000 [IU] | INTRAMUSCULAR | Status: DC
  Administered 2022-03-01: 8 [IU] via SUBCUTANEOUS
  Filled 2022-03-01: qty 1

## 2022-03-01 MED ORDER — AMLODIPINE BESYLATE 10 MG PO TABS
10.0000 mg | ORAL_TABLET | Freq: Every day | ORAL | Status: DC
  Administered 2022-03-01 – 2022-03-03 (×2): 10 mg via ORAL
  Filled 2022-03-01: qty 1
  Filled 2022-03-01 (×2): qty 2

## 2022-03-01 MED ORDER — LISINOPRIL 20 MG PO TABS
20.0000 mg | ORAL_TABLET | Freq: Every day | ORAL | Status: DC
  Administered 2022-03-01 – 2022-03-04 (×3): 20 mg via ORAL
  Filled 2022-03-01 (×2): qty 1
  Filled 2022-03-01 (×2): qty 2

## 2022-03-01 MED ORDER — FUROSEMIDE 10 MG/ML IJ SOLN
40.0000 mg | Freq: Two times a day (BID) | INTRAMUSCULAR | Status: DC
  Administered 2022-03-02 – 2022-03-04 (×4): 40 mg via INTRAVENOUS
  Filled 2022-03-01 (×4): qty 4

## 2022-03-01 MED ORDER — ONDANSETRON HCL 4 MG PO TABS: 4.0000 mg | ORAL_TABLET | Freq: Four times a day (QID) | ORAL | Status: DC | PRN

## 2022-03-01 MED ORDER — INSULIN ASPART 100 UNIT/ML IJ SOLN
0.0000 [IU] | Freq: Three times a day (TID) | INTRAMUSCULAR | Status: DC
  Administered 2022-03-01: 11 [IU] via SUBCUTANEOUS
  Administered 2022-03-02: 5 [IU] via SUBCUTANEOUS
  Administered 2022-03-02: 8 [IU] via SUBCUTANEOUS
  Administered 2022-03-02: 3 [IU] via SUBCUTANEOUS
  Administered 2022-03-03: 5 [IU] via SUBCUTANEOUS
  Administered 2022-03-03: 8 [IU] via SUBCUTANEOUS
  Administered 2022-03-03: 3 [IU] via SUBCUTANEOUS
  Filled 2022-03-01 (×7): qty 1

## 2022-03-01 MED ORDER — SODIUM CHLORIDE 0.9 % IV SOLN
500.0000 mg | Freq: Once | INTRAVENOUS | Status: AC
  Administered 2022-03-01: 500 mg via INTRAVENOUS
  Filled 2022-03-01: qty 5

## 2022-03-01 MED ORDER — ROPINIROLE HCL 1 MG PO TABS
1.0000 mg | ORAL_TABLET | Freq: Every day | ORAL | Status: DC
  Administered 2022-03-01 – 2022-03-02 (×2): 1 mg via ORAL
  Filled 2022-03-01 (×3): qty 1

## 2022-03-01 MED ORDER — IPRATROPIUM-ALBUTEROL 0.5-2.5 (3) MG/3ML IN SOLN
3.0000 mL | Freq: Once | RESPIRATORY_TRACT | Status: AC
  Administered 2022-03-01: 3 mL via RESPIRATORY_TRACT
  Filled 2022-03-01: qty 3

## 2022-03-01 MED ORDER — SODIUM CHLORIDE 0.9 % IV SOLN
2.0000 g | Freq: Once | INTRAVENOUS | Status: AC
  Administered 2022-03-01: 2 g via INTRAVENOUS
  Filled 2022-03-01: qty 20

## 2022-03-01 MED ORDER — SERTRALINE HCL 50 MG PO TABS
200.0000 mg | ORAL_TABLET | Freq: Every day | ORAL | Status: DC
  Administered 2022-03-01 – 2022-03-04 (×4): 200 mg via ORAL
  Filled 2022-03-01 (×4): qty 4

## 2022-03-01 MED ORDER — ENOXAPARIN SODIUM 80 MG/0.8ML IJ SOSY
0.5000 mg/kg | PREFILLED_SYRINGE | INTRAMUSCULAR | Status: DC
  Administered 2022-03-02 – 2022-03-03 (×3): 62.5 mg via SUBCUTANEOUS
  Filled 2022-03-01 (×2): qty 0.63
  Filled 2022-03-01 (×2): qty 0.8

## 2022-03-01 NOTE — H&P (Addendum)
History and Physical    Patient: Kevin Garrett SWN:[id number] DOB: 1975-06-17 DOA: 03/01/2022 DOS: the patient was seen and examined on 03/01/2022 [facility]: [facility]  Patient coming from: Home  Chief Complaint:  Chief Complaint  Patient presents with   Shortness of Breath   Most of the history was obtained from patient's daughter and girlfriend at the bedside.  Patient unable to provide any history HPI: Kevin Garrett is a 46 y.o. male with medical history significant for cocaine use, nicotine dependence, hypertension, diabetes mellitus, morbid obesity, sleep apnea on CPAP, history of brain injury with sleep disturbances who was brought into the ER by his girlfriend for evaluation of mental status changes. Per girlfriend his symptoms started 2 days prior to his admission and has progressively worsened.  She states that he has become more sleepy and lethargic, confused and has involuntary jerking movements involving his lower extremities and has been flailing around. Patient's daughter said that he was in his usual state of health 3 days prior when they had gone for lunch.  She notes that her father falls asleep very easily and states that he has worsened over time.  He has not used his CPAP in over a month because one of the parts needs to be replaced. Girlfriend said his last cocaine use was about 2 to 3 days prior to this admission. Patient was placed in a room in the ER and appeared very lethargic and sleepy.  Room air pulse oximetry was 98%.  He was tachypneic with respiratory rate of 28, blood pressure was elevated at 154/112 There was concern for possible hypercapnia and patient had an ABG which showed 7.43/57/44/37.8. Urine drug screen was positive for cocaine CT angiogram showed no evidence of PE, aneurysm or dissection. Diffuse ground-glass opacities throughout lungs consistent with pneumonitis, atypical infection or edema. He received a dose of Lasix 40 mg IV push,  Solu-Medrol 60 mg IV and bronchodilator therapy. He will be admitted to the hospital for further evaluation  Review of Systems: unable to review all systems due to the inability of the patient to answer questions. Past Medical History:  Diagnosis Date   Anxiety    Diabetes mellitus without complication ([place])    Hypertension    Nicotine dependence 10/18/2016   Sleep apnea    Vitamin D deficiency 10/18/2016   Past Surgical History:  Procedure Laterality Date   ORIF HUMERUS FRACTURE Left 10/17/2016   Procedure: OPEN REDUCTION INTERNAL FIXATION (ORIF) LEFT DISTAL HUMERUS FRACTURE;  Surgeon: Altamese [place], MD;  Location: [place];  Service: Orthopedics;  Laterality: Left;   Social History:  reports that he has quit smoking. His smoking use included cigarettes. He smoked an average of .5 packs per day. He has never used smokeless tobacco. He reports that he does not currently use alcohol. He reports current drug use. Frequency: 2.00 times per week. Drug: Cocaine.  No Known Allergies  History reviewed. No pertinent family history.  Prior to Admission medications   Medication Sig Start Date End Date Taking? Authorizing Provider  amLODipine (NORVASC) 10 MG tablet Take 10 mg by mouth daily.   Yes [provider]  chlorthalidone (HYGROTON) 25 MG tablet Take 25 mg by mouth daily.   Yes [provider]  citalopram (CELEXA) 10 MG tablet Take 3 tablets (30 mg total) by mouth daily. 10/21/16  Yes Ainsley Spinner, PA-C  gabapentin (NEURONTIN) 300 MG capsule Take 300 mg by mouth 2 (two) times daily.   Yes [provider]  hydrochlorothiazide (HYDRODIURIL) 25 MG tablet Take 25 mg by mouth daily.   Yes [provider]  JARDIANCE 25 MG TABS tablet Take 25 mg by mouth daily. 01/10/22  Yes [provider]  lisinopril (PRINIVIL,ZESTRIL) 20 MG tablet Take 20 mg by mouth daily.   Yes [provider]  meloxicam (MOBIC) 15 MG tablet Take 1 tablet (15 mg total) by mouth  daily. 12/03/18  Yes Sable Feil, PA-C  metFORMIN (GLUCOPHAGE) 1000 MG tablet Take 1,000 mg by mouth 2 (two) times daily.   Yes [provider]  sertraline (ZOLOFT) 100 MG tablet Take 200 mg by mouth daily.   Yes [provider]  traMADol (ULTRAM) 50 MG tablet Take 1 tablet (50 mg total) by mouth every 12 (twelve) hours as needed. 12/07/18  Yes Sable Feil, PA-C  metFORMIN (GLUCOPHAGE) 500 MG tablet Take 500 mg by mouth 2 (two) times daily with a meal. Patient not taking: Reported on 03/01/2022    [provider]    Physical Exam: Vitals:   03/01/22 1145 03/01/22 1200 03/01/22 1230 03/01/22 1240  BP:  (!) 151/80 (!) 136/122   Pulse: (!) 106 89 92 92  Resp:      Temp:      TempSrc:      SpO2: 95% 93%  96%  Weight:      Height:       Physical Exam Vitals and nursing note reviewed.  Constitutional:      Appearance: He is obese.     Comments: Lethargic on CPAP.  Opens eyes to loud verbal stimuli but unable to stay awake.  Noted to be in moderate respiratory distress with use of abdominal muscles  HENT:     Head: Normocephalic and atraumatic.  Eyes:     Pupils: Pupils are equal, round, and reactive to light.  Cardiovascular:     Rate and Rhythm: Normal rate and regular rhythm.  Pulmonary:     Effort: Tachypnea present.     Breath sounds: Examination of the right-lower field reveals rales. Examination of the left-lower field reveals rales. Rales present.  Abdominal:     General: Bowel sounds are normal.     Palpations: Abdomen is soft.  Musculoskeletal:        General: Normal range of motion.     Cervical back: Normal range of motion and neck supple.  Skin:    General: Skin is warm and dry.  Neurological:     Comments: Unable to assess  Psychiatric:     Comments: Unable to assess     Data Reviewed: Relevant notes from primary care and specialist visits, past discharge summaries as available in EHR, including Care Everywhere. Prior  diagnostic testing as pertinent to current admission diagnoses Updated medications and problem lists for reconciliation ED course, including vitals, labs, imaging, treatment and response to treatment Triage notes, nursing and pharmacy notes and ED provider's notes Notable results as noted in HPI Labs reviewed.  Procalcitonin less than 0.10, urine drug screen positive for cocaine, troponin 36, BNP 123, ammonia 41, sodium 137, potassium 3.5, chloride 104, bicarb 29, glucose 240, BUN 15, creatinine 0.92, calcium 9.0, total protein 8.3, albumin 4.1, AST 24, ALT 28, alkaline phosphatase 61, total bilirubin 0.9, white count 7.2, hemoglobin 15.1, hematocrit 44.9, platelet count 198 Respiratory viral panel is negative Chest x-ray reviewed by me shows Cardiomegaly accentuated by low volume chest. No focal or acute finding. CT scan of the head without contrast shows  no acute findings CT angiogram of the chest shows no evidence of PE, aneurysm or dissection. Diffuse ground-glass opacities throughout lungs consistent with pneumonitis, atypical infection or edema. Twelve-lead EKG reviewed by me shows normal sinus rhythm with ST and T wave abnormality in the lateral leads There are no new results to review at this time.  Assessment and Plan: * Acute metabolic encephalopathy Patient presents to the ER for evaluation of mental status changes described as confusion and involuntary movements of his lower extremities and flailing of his arms. Patient is very lethargic but arouses to loud verbal stimuli and is unable to stay awake Chart review shows patient has a history of prior brain injury with sleep disturbances and has been prescribed Requip in the past ABG does not show any evidence of hypercapnia Will request neurology consult Obtain EEG Obtain MRI of the brain without contrast for further evaluation   Acute diastolic CHF (congestive heart failure) ([place]) Patient with shortness of breath and use of  accessory muscles CT angiogram shows diffuse ground-glass opacities throughout lungs consistent with pneumonitis, atypical infection or edema. Blood pressure was checked currently elevated upon arrival to the ER and chest x-ray shows cardiomegaly concerning for possible CHF Optimize blood pressure control Place patient on IV furosemide Obtain 2D echocardiogram to assess LVEF  Hypertensive urgency Most likely secondary to medication noncompliance Continue amlodipine and lisinopril Uptitrate medications to optimize blood pressure control  Depression Continue Sertraline  Obesity, Class III, BMI 40-49.9 (morbid obesity) (HCC) BMI 41 Complicates overall prognosis and care  Nicotine dependence Will discuss smoking cessation with patient once mental status improves Place patient on a nicotine transdermal patch  Sleep apnea Continue CPAP at bedtime  Diabetes mellitus without complication (HCC) Hold metformin Consistent carbohydrate diet Glycemic control with sliding scale insulin Check blood sugars every 4 hours      Advance Care Planning:   Code Status: Full Code   Consults: None  Family Communication: Greater than 50% of time was spent discussing patient's condition and plan of care with his daughter and girlfriend at the bedside.  All questions and concerns have been addressed.  They verbalized understanding and agreed with the plan.  CODE STATUS was discussed and patient is full code  Severity of Illness: The appropriate patient status for this patient is INPATIENT. Inpatient status is judged to be reasonable and necessary in order to provide the required intensity of service to ensure the patient's safety. The patient's presenting symptoms, physical exam findings, and initial radiographic and laboratory data in the context of their chronic comorbidities is felt to place them at high risk for further clinical deterioration. Furthermore, it is not anticipated that the patient  will be medically stable for discharge from the hospital within 2 midnights of admission.   * I certify that at the point of admission it is my clinical judgment that the patient will require inpatient hospital care spanning beyond 2 midnights from the point of admission due to high intensity of service, high risk for further deterioration and high frequency of surveillance required.*  Author: Collier Bullock, MD 03/01/2022 2:08 PM  For on call review [email or web address].

## 2022-03-01 NOTE — ED Provider Notes (Signed)
[facility] Provider Note    Event Date/Time   First MD Initiated Contact with Patient 03/01/22 [phone]     (approximate)   History   Shortness of Breath   HPI  Kevin Garrett is a 46 y.o. male past medical history of diabetes, sleep apnea who presents with shortness of breath.  Patient is accompanied by partner who tells me that since yesterday he has been short of breath.  Patient is altered and cannot provide much history.  Does tell me has been coughing.  She denies any fevers or chills or no nausea or vomiting.  Says he is also been flailing and has had some abnormal movements of his legs.  Has also been more confused.  Tells me he has been not wearing CPAP for about a month because he needed to have a part replaced with a stent or have done that but otherwise before that was compliant with his CPAP.  Does smoke but no history of COPD that they are aware of.     Past Medical History:  Diagnosis Date   Anxiety    Diabetes mellitus without complication ([place])    Hypertension    Nicotine dependence 10/18/2016   Sleep apnea    Vitamin D deficiency 10/18/2016    Patient Active Problem List   Diagnosis Date Noted   Nicotine dependence 10/18/2016   Vitamin D deficiency 10/18/2016   Hypertension    Diabetes mellitus without complication ([place])    Sleep apnea    Closed left scapular fracture 10/15/2016   Closed displaced segmental fracture of shaft of left humerus 10/15/2016     Physical Exam  Triage Vital Signs: ED Triage Vitals  Enc Vitals Group     BP 03/01/22 0649 (!) 154/112     Pulse Rate 03/01/22 0649 97     Resp 03/01/22 0649 (!) 28     Temp 03/01/22 0649 98.1 F (36.7 C)     Temp Source 03/01/22 0649 Oral     SpO2 03/01/22 0649 100 %     Weight 03/01/22 0646 280 lb (127 kg)     Height 03/01/22 0646 '5\' 9"'$  (1.753 m)     Head Circumference --      Peak Flow --      Pain Score 03/01/22 0646 9     Pain Loc --      Pain Edu? --      Excl. in  [place]? --     Most recent vital signs: Vitals:   03/01/22 1050 03/01/22 1058  BP:    Pulse:    Resp:    Temp:    SpO2: 100% 100%     General: Patient is somnolent but arouses with sternal rub CV:  Good peripheral perfusion.  Resp:  Labored breathing, no obvious wheezing but decreased breath sounds throughout Abd:  No distention.  Neuro:             Patient is somnolent but when he awakens he follows commands has equal grip in bilateral upper extremities and equal strength in bilateral lower extremities intermittently some jerking in the lower extremities Pupils are about 2 mm bilaterally reactive   ED Results / Procedures / Treatments  Labs (all labs ordered are listed, but only abnormal results are displayed) Labs Reviewed  COMPREHENSIVE METABOLIC PANEL - Abnormal; Notable for the following components:      Result Value   Glucose, Bld 240 (*)    Total Protein 8.3 (*)  Anion gap 4 (*)    All other components within normal limits  URINE DRUG SCREEN, QUALITATIVE ([facility] ONLY) - Abnormal; Notable for the following components:   Cocaine Metabolite,[place] POSITIVE (*)    All other components within normal limits  BLOOD GAS, VENOUS - Abnormal; Notable for the following components:   Bicarbonate 37.8 (*)    Acid-Base Excess 11.2 (*)    All other components within normal limits  AMMONIA - Abnormal; Notable for the following components:   Ammonia 41 (*)    All other components within normal limits  BRAIN NATRIURETIC PEPTIDE - Abnormal; Notable for the following components:   B Natriuretic Peptide 123.3 (*)    All other components within normal limits  TROPONIN I (HIGH SENSITIVITY) - Abnormal; Notable for the following components:   Troponin I (High Sensitivity) 38 (*)    All other components within normal limits  TROPONIN I (HIGH SENSITIVITY) - Abnormal; Notable for the following components:   Troponin I (High Sensitivity) 36 (*)    All other components within normal limits   RESP PANEL BY RT-PCR (RSV, FLU A&B, COVID)  RVPGX2  CBC WITH DIFFERENTIAL/PLATELET  ETHANOL  LIPASE, BLOOD     EKG  EKG reviewed interpreted myself shows normal sinus rhythm with normal axis normal intervals poor R wave progression lateral T wave inversions   RADIOLOGY Chest x-ray reviewed by myself and interpreted shows cardiomegaly with poor inspiratory effort   PROCEDURES:  Critical Care performed: Yes, see critical care procedure note(s)  .Critical Care  Performed by: Rada Hay, MD Authorized by: Rada Hay, MD   Critical care provider statement:    Critical care time (minutes):  30   Critical care was time spent personally by me on the following activities:  Development of treatment plan with patient or surrogate, discussions with consultants, evaluation of patient's response to treatment, examination of patient, ordering and review of laboratory studies, ordering and review of radiographic studies, ordering and performing treatments and interventions, pulse oximetry, re-evaluation of patient's condition and review of old charts   The patient is on the cardiac monitor to evaluate for evidence of arrhythmia and/or significant heart rate changes.   MEDICATIONS ORDERED IN ED: Medications  azithromycin (ZITHROMAX) 500 mg in sodium chloride 0.9 % 250 mL IVPB (has no administration in time range)  furosemide (LASIX) injection 40 mg (has no administration in time range)  iohexol (OMNIPAQUE) 350 MG/ML injection 75 mL (75 mLs Intravenous Contrast Given 03/01/22 0905)  cefTRIAXone (ROCEPHIN) 2 g in sodium chloride 0.9 % 100 mL IVPB (2 g Intravenous New Bag/Given 03/01/22 1053)  methylPREDNISolone sodium succinate (SOLU-MEDROL) 125 mg/2 mL injection 60 mg (60 mg Intravenous Given 03/01/22 1055)  ipratropium-albuterol (DUONEB) 0.5-2.5 (3) MG/3ML nebulizer solution 3 mL (3 mLs Nebulization Given 03/01/22 1052)  ipratropium-albuterol (DUONEB) 0.5-2.5 (3) MG/3ML  nebulizer solution 3 mL (3 mLs Nebulization Given 03/01/22 1051)     IMPRESSION / MDM / ASSESSMENT AND PLAN / ED COURSE  I reviewed the triage vital signs and the nursing notes.                              Patient's presentation is most consistent with acute presentation with potential threat to life or bodily function.  Differential diagnosis includes, but is not limited to, hypercarbia, viral illness, intoxication, intracranial hemorrhage  Patient is a 46 year old male with history of tobacco use and sleep apnea presents with shortness  of breath.  Patient is somnolent with labored respirations.  He does awaken with sternal rub but quickly falls back asleep.  Breath sounds are decreased bilaterally.  He is not hypoxic but is tachypneic.  Concern for hypercarbia.  Of asked nursing to obtain an VBG.  Chest x-ray shows poor inspiratory effort but no obvious focal abnormality.  EKG showing some lateral T wave inversions.  Patient does appear to have nonfocal neurologic exam other than his altered mental status.  Plan to obtain stat VBG likely will need BiPAP.  Will need CT head.  Anticipate admission.  Patient's blood gas actually does not show significant hypercarbia.  He does continue to have tachypnea he is on 4 L currently as he did have a desaturation episode but on my evaluation is satting 99% on 1 L.  Will obtain a CT head as an acute intracranial process could be causing his irregular and labored respirations.  Will obtain a CTA of the chest at the same time to evaluate for any other acute pathology in the chest.  Patient will need a UDS.   Patient is on CPAP work breathing has improved.  UDS positive for cocaine which I suspect is contributing to his presentation.  Given his altered mental status questionable pneumonia and increased work of breathing will talk to the hospitalist for admission.  FINAL CLINICAL IMPRESSION(S) / ED DIAGNOSES   Final diagnoses:  Altered mental status,  unspecified altered mental status type     Rx / DC Orders   ED Discharge Orders     None        Note:  This document was prepared using Dragon voice recognition software and may include unintentional dictation errors.   Rada Hay, MD 03/01/22 1124

## 2022-03-01 NOTE — ED Notes (Signed)
Pt taken to MRI on 4L NA, cpap paused. Pt woke up and was irritable, demanding food.

## 2022-03-01 NOTE — ED Notes (Signed)
Provider turned oxygen to 2L from 4L. Pt sleeping, 93%. ST on monitor.

## 2022-03-01 NOTE — ED Notes (Signed)
Pt yelling and screaming with 20g IV insertion attempt. EDP called to come do ultrasound IV for CTA. Pt not cooperating with care.

## 2022-03-01 NOTE — Assessment & Plan Note (Signed)
BMI 41 Complicates overall prognosis and care

## 2022-03-01 NOTE — Assessment & Plan Note (Addendum)
Improving as he is starting to wake more and receive CPAP.  No evidence of hypercapnia, MRI unremarkable for CVA.  Patient refusing EEG, but part of this is felt to be secondary to severe uncontrolled sleep apnea.

## 2022-03-01 NOTE — Progress Notes (Signed)
Anticoagulation monitoring(Lovenox):  46yo  M ordered Lovenox 40 mg Q24h    Filed Weights   03/01/22 0646  Weight: 127 kg (280 lb)   BMI 41   Lab Results  Component Value Date   CREATININE 0.92 03/01/2022   CREATININE 1.36 (H) 02/18/2020   CREATININE 1.34 (H) 12/07/2018   Estimated Creatinine Clearance: 132.3 mL/min (by C-G formula based on SCr of 0.92 mg/dL). Hemoglobin & Hematocrit     Component Value Date/Time   HGB 15.1 03/01/2022 0655   HGB 14.7 05/03/2012 1710   HCT 44.9 03/01/2022 0655   HCT 43.0 05/03/2012 1710     Per Protocol for Patient with estCrcl > 30 ml/min and BMI > 30, will transition to Lovenox 0.5 mg/kg Q24h       Chinita [place] PharmD Clinical Pharmacist 03/01/2022

## 2022-03-01 NOTE — Assessment & Plan Note (Signed)
Continue Sertraline

## 2022-03-01 NOTE — Assessment & Plan Note (Addendum)
Noncompliant with CPAP at home, have started CPAP at bedtime will see if we can arrange for new CPAP at home

## 2022-03-01 NOTE — ED Notes (Signed)
EDP at bedside. Partner at bedside states that since yesterday pt has been "flailing in bed" and lethargic. Has been out of cpap for sleep apnea for 1 month. Pt unable to stay awake. Provider ordered VBG.

## 2022-03-01 NOTE — ED Notes (Signed)
Pt would not allow EEG tech to do EEG, stating "because I'm fucking hungry!" And sent her out room and was not willing to lay still and told her to "fuck off!".

## 2022-03-01 NOTE — ED Notes (Signed)
ED tech brought pt to room. Pt appears altered, not staying awake. UDS pending, ETOH was sent. SPO2 is >98% on RA.

## 2022-03-01 NOTE — Assessment & Plan Note (Signed)
Will discuss smoking cessation with patient once mental status improves Place patient on a nicotine transdermal patch

## 2022-03-01 NOTE — Assessment & Plan Note (Addendum)
Patient with shortness of breath and use of accessory muscles.  Started on IV Lasix.  2D echo results notes grade 2 diastolic dysfunction.  Chest x-ray notes cardiomegaly.

## 2022-03-01 NOTE — ED Notes (Signed)
Pt sleeping again. CPAP in place.

## 2022-03-01 NOTE — ED Triage Notes (Addendum)
Pt to triage via w/c, appears sleepy, jerky movements of legs--denies any ETOH or drug use, st "it's my sleep apnea"; st SHOB since yesterday with nonprod cough; also c/o rt upper abd pain

## 2022-03-01 NOTE — ED Notes (Signed)
Pt desaturating to 85%. Placed on 4L. VBG just sent. Pt will likely go on bipap. Pt unable to stay awake and only rouses to painful sterbal rub. EDP will notify charge RN that pt will need to move to main ED.

## 2022-03-01 NOTE — ED Notes (Signed)
Dr Francine Graven came and reassessed pt at bedside. Pt wakes up and answers questions. Pt was explained that would need to stay awake to actually eat. Pt appears mildly confused. Girlfriend at bedside.

## 2022-03-01 NOTE — Assessment & Plan Note (Addendum)
Hold metformin.  Sliding scale for now.  notes uncontrolled sugars with A1c at 8.6.  Increase sliding scale coverage.  Added glipizide 2.5 p.o. daily

## 2022-03-01 NOTE — ED Notes (Signed)
RT placing cpap.

## 2022-03-01 NOTE — ED Notes (Signed)
Spoke with RT, okay to take pt off cpap to go to MRI. Cpap was for pt while sleeping (he is supposed to use at home). Pt is awake and wants food, provider aware. Still noticeably restless.

## 2022-03-01 NOTE — ED Notes (Signed)
Report given to CPOD RN 

## 2022-03-01 NOTE — ED Notes (Signed)
Pt is much more awake now. Pt begging for food. Pt is currently on CPAP. Phone was provided to pt daughter for MRI screening.

## 2022-03-01 NOTE — Assessment & Plan Note (Addendum)
Most likely secondary to medication noncompliance, cocaine use and acute heart failure.  Resolved after Lasix, continue Norvasc and lisinopril.  Try to avoid beta-blockers given cocaine use

## 2022-03-01 NOTE — ED Notes (Signed)
Pt is yelling from room that he wants food. Pt is again awake. Wrote to hospitalist. CPCP was not connected and SPO2 remains in 90s. Attempted to place nasal cannula back on pt, pt physically resisted placement of [place] and pushed hann of this RN away. Pt is irritable.

## 2022-03-01 NOTE — ED Notes (Signed)
Obtained diet order for pt.

## 2022-03-02 ENCOUNTER — Inpatient Hospital Stay (HOSPITAL_COMMUNITY)
Admit: 2022-03-02 | Discharge: 2022-03-02 | Disposition: A | Discharge status: Home / Self Care | Payer: Medicare Other | Attending: Internal Medicine | Admitting: Internal Medicine

## 2022-03-02 ENCOUNTER — Encounter: Payer: Self-pay | Admitting: Internal Medicine

## 2022-03-02 DIAGNOSIS — I5031 Acute diastolic (congestive) heart failure: Secondary | ICD-10-CM

## 2022-03-02 DIAGNOSIS — Z72820 Sleep deprivation: Secondary | ICD-10-CM

## 2022-03-02 DIAGNOSIS — I160 Hypertensive urgency: Secondary | ICD-10-CM | POA: Diagnosis not present

## 2022-03-02 DIAGNOSIS — G2581 Restless legs syndrome: Secondary | ICD-10-CM | POA: Diagnosis not present

## 2022-03-02 DIAGNOSIS — F1410 Cocaine abuse, uncomplicated: Secondary | ICD-10-CM

## 2022-03-02 DIAGNOSIS — R410 Disorientation, unspecified: Secondary | ICD-10-CM | POA: Diagnosis not present

## 2022-03-02 DIAGNOSIS — E119 Type 2 diabetes mellitus without complications: Secondary | ICD-10-CM

## 2022-03-02 DIAGNOSIS — G4719 Other hypersomnia: Secondary | ICD-10-CM

## 2022-03-02 DIAGNOSIS — G9341 Metabolic encephalopathy: Secondary | ICD-10-CM | POA: Diagnosis not present

## 2022-03-02 DIAGNOSIS — G4733 Obstructive sleep apnea (adult) (pediatric): Secondary | ICD-10-CM

## 2022-03-02 LAB — BASIC METABOLIC PANEL
Anion gap: 9 (ref 5–15)
BUN: 25 mg/dL — ABNORMAL HIGH (ref 6–20)
CO2: 28 mmol/L (ref 22–32)
Calcium: 8.5 mg/dL — ABNORMAL LOW (ref 8.9–10.3)
Chloride: 103 mmol/L (ref 98–111)
Creatinine, Ser: 1.17 mg/dL (ref 0.61–1.24)
GFR, Estimated: >60 mL/min (ref >60)
Glucose, Bld: 236 mg/dL — ABNORMAL HIGH (ref 70–99)
Potassium: 3.6 mmol/L (ref 3.5–5.1)
Sodium: 140 mmol/L (ref 135–145)

## 2022-03-02 LAB — CBG MONITORING, ED
Glucose-Capillary: 238 mg/dL — ABNORMAL HIGH (ref 70–99)
Glucose-Capillary: 225 mg/dL — ABNORMAL HIGH (ref 70–99)
Glucose-Capillary: 296 mg/dL — ABNORMAL HIGH (ref 70–99)
Glucose-Capillary: 173 mg/dL — ABNORMAL HIGH (ref 70–99)

## 2022-03-02 LAB — CBC
HCT: 42.6 % (ref 39.0–52.0)
Hemoglobin: 14.6 g/dL (ref 13.0–17.0)
MCH: 28.7 pg (ref 26.0–34.0)
MCHC: 34.3 g/dL (ref 30.0–36.0)
MCV: 83.7 fL (ref 80.0–100.0)
Platelets: 237 10*3/uL (ref 150–400)
RBC: 5.09 MIL/uL (ref 4.22–5.81)
RDW: 13.8 % (ref 11.5–15.5)
WBC: 10.5 10*3/uL (ref 4.0–10.5)
nRBC: 0 % (ref 0.0–0.2)

## 2022-03-02 LAB — ECHOCARDIOGRAM COMPLETE
AR max vel: 2.77 cm2
AV Area VTI: 2.48 cm2
AV Area mean vel: 2.9 cm2
AV Mean grad: 3 mmHg
AV Peak grad: 6.2 mmHg
Ao pk vel: 1.24 m/s
Area-P 1/2: 3.59 cm2
Height: 69 in
S' Lateral: 3.1 cm
Weight: 4480 oz

## 2022-03-02 LAB — FERRITIN: Ferritin: 119 ng/mL (ref 24–336)

## 2022-03-02 LAB — HEMOGLOBIN A1C
Hgb A1c MFr Bld: 8.6 % — ABNORMAL HIGH (ref 4.8–5.6)
Mean Plasma Glucose: 200 mg/dL

## 2022-03-02 LAB — GLUCOSE, CAPILLARY: Glucose-Capillary: 277 mg/dL — ABNORMAL HIGH (ref 70–99)

## 2022-03-02 MED ORDER — PREGABALIN 75 MG PO CAPS
75.0000 mg | ORAL_CAPSULE | Freq: Two times a day (BID) | ORAL | Status: DC
  Administered 2022-03-02 – 2022-03-03 (×2): 75 mg via ORAL
  Filled 2022-03-02 (×2): qty 1

## 2022-03-02 NOTE — ED Notes (Signed)
Request made for transport to the floor ?

## 2022-03-02 NOTE — ED Notes (Signed)
Report given to Rachael RN.

## 2022-03-02 NOTE — Progress Notes (Signed)
[facility] Progress Note  Patient: Kevin Garrett    GGE:[id number]  DOA: 03/01/2022    Date of Service: the patient was seen and examined on 03/02/2022  Brief hospital course: 46 year old male with past medical history of morbid obesity, cocaine abuse, medical noncompliance with medications and CPAP, obstructive sleep apnea and hypertension plus diabetes who presented to the emergency room on 12/20 with lethargy, confusion and involuntary jerking movements.  According to patient's daughter, he was in his usual state of health 3 days prior, but notes that he falls asleep very easily.  According to patient's girlfriend, he last used cocaine 2 to 3 days prior.  In the emergency room, patient noted to be mildly tachypneic with respiratory rate of 28 and blood pressure with systolic as high in the 465K and diastolic as high as in the 110s.  BNP minimally elevated and ABG noted no significant hypercapnia.  Patient once given 1 dose of Lasix and diuresed almost a full liter.  Patient seen by neurology for confusion and involuntary leg movements and felt to be secondary to severe restless leg syndrome and uncontrolled sleep apnea.  EEG was attempted, but patient was at times aggressive and verbally abusive, refusing testing.   Assessment and Plan: * Acute metabolic encephalopathy No evidence of hypercapnia, MRI unremarkable for CVA.  Patient refusing EEG, but part of this is felt to be secondary to severe uncontrolled sleep apnea.     Acute diastolic CHF (congestive heart failure) ([place]) Patient with shortness of breath and use of accessory muscles.  Started on IV Lasix.  2D echo results pending.  Chest x-ray notes cardiomegaly.  Hypertensive urgency Most likely secondary to medication noncompliance, cocaine use and acute heart failure.  Should improve with Lasix, continue Norvasc and lisinopril.  Try to avoid beta-blockers given cocaine use  Depression Continue Sertraline  Obesity, Class III,  BMI 40-49.9 (morbid obesity) (HCC) BMI 41 Complicates overall prognosis and care  Nicotine dependence Will discuss smoking cessation with patient once mental status improves Place patient on a nicotine transdermal patch  Sleep apnea Noncompliant with CPAP at home, have started CPAP at bedtime  Diabetes mellitus without complication (HCC) Hold metformin.  Sliding scale for now.  A1c pending.       Body mass index is 41.35 kg/m.        Consultants: Neurology  Procedures: Echocardiogram, results pending  Antimicrobials: None  Code Status: Full code   Subjective: Patient somnolent  Objective: Mildly elevated blood pressures, much improved from admission Vitals:   03/02/22 1318 03/02/22 1430  BP: (!) 131/97 (!) 143/92  Pulse: 82   Resp: 16   Temp:    SpO2: 95%    No intake or output data in the 24 hours ending 03/02/22 1654 Filed Weights   03/01/22 0646  Weight: 127 kg   Body mass index is 41.35 kg/m.  Exam:  General: Somnolent HEENT: Normocephalic, atraumatic, mucous membranes are moist, narrow airway Cardiovascular: Regular rate and rhythm, S1-S2 Respiratory: Decreased breath sounds throughout secondary to body habitus Abdomen: Soft, obese, nontender, positive bowel sounds Musculoskeletal: No clubbing or cyanosis, trace pitting edema Skin: No skin breaks, tears or lesions Psychiatry: Appropriate, no evidence of psychoses Neurology: No focal deficits, although limited exam due to patient's somnolence  Data Reviewed: Creatinine with mild increase to 1.17, normal ammonia level, normal procalcitonin  Disposition:  Status is: Inpatient Remains inpatient appropriate because:  -Results of echo -Needs to be more awake    Anticipated discharge date: 12/22  Family Communication: Will call daughter DVT Prophylaxis: Lovenox    Author: Annita Brod ,MD 03/02/2022 4:54 PM  To reach On-call, see care teams to locate the attending and reach  out via [email or web address]. Between 7PM-7AM, please contact night-coverage If you still have difficulty reaching the attending provider, please page the [facility] (Director on Call) for [facility] on amion for assistance.

## 2022-03-02 NOTE — ED Notes (Signed)
Pt keeps taking off BP cuff and is constantly moving so this nurse is unable to retrieve a O2 level and a BP every hour.
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
## 2022-03-02 NOTE — Progress Notes (Addendum)
Neurology was consulted for AMS and involuntary leg movements in patient with hx substance use who was cocaine positive on admission. From the door prior to waking him up I could see kicking movements of his legs during sleep that improved after he awoke. When I asked him about the movements he yelled that they are due to his restless legs syndrome. He states they are unchanged from typical. He became extremely agitated and would not cooperate with history, med rec, or exam. The movements I witnessed are c/w uncontrolled restless legs syndrome but I am unable to provide further recommendations on mgmt without being able to interview or examine the patient. He refused EEG. I will attempt consult again tomorrow when hopefully he is more calm. Of note given his substance abuse he would be a poor long-term candidate for dopamine agonists such as ropinirole or pramipexole that are first line agents for RLS because they can drive addiction and compulsive behavior. He is currently prescribed ropinirole but I will not stop it overnight (until I am able to take a history) given the severity of his witnessed symptoms. Will check a ferritin as iron deficiency can worsen RLS.  Su Monks, MD [facility] [phone]  If 7pm- 7am, please page neurology on call as listed in [place].

## 2022-03-02 NOTE — Hospital Course (Addendum)
46 year old male with past medical history of morbid obesity, cocaine abuse, medical noncompliance with medications and CPAP, obstructive sleep apnea and hypertension plus diabetes who presented to the emergency room on 12/20 with lethargy, confusion and involuntary jerking movements.  According to patient's daughter, he was in his usual state of health 3 days prior, but notes that he falls asleep very easily.  According to patient's girlfriend, he last used cocaine 2 to 3 days prior.  In the emergency room, patient noted to be mildly tachypneic with respiratory rate of 28 and blood pressure with systolic as high in the 262M and diastolic as high as in the 110s.  BNP minimally elevated and ABG noted no significant hypercapnia.  Patient once given 1 dose of Lasix and diuresed almost a full liter.  Patient seen by neurology for confusion and involuntary leg movements and felt to be secondary to severe restless leg syndrome and uncontrolled sleep apnea.  EEG was attempted, but patient was at times aggressive and verbally abusive, refusing testing.  By 12/22, patient starting to be a little more awake.  CBGs slowly improving.

## 2022-03-02 NOTE — Progress Notes (Signed)
*  PRELIMINARY RESULTS* Echocardiogram 2D Echocardiogram has been performed.  Kevin Garrett 03/02/2022, 1:22 PM

## 2022-03-03 DIAGNOSIS — Z72820 Sleep deprivation: Secondary | ICD-10-CM | POA: Diagnosis not present

## 2022-03-03 DIAGNOSIS — I5031 Acute diastolic (congestive) heart failure: Secondary | ICD-10-CM | POA: Diagnosis not present

## 2022-03-03 DIAGNOSIS — G2581 Restless legs syndrome: Secondary | ICD-10-CM | POA: Diagnosis not present

## 2022-03-03 DIAGNOSIS — I160 Hypertensive urgency: Secondary | ICD-10-CM | POA: Diagnosis not present

## 2022-03-03 DIAGNOSIS — G9341 Metabolic encephalopathy: Secondary | ICD-10-CM | POA: Diagnosis not present

## 2022-03-03 DIAGNOSIS — F1410 Cocaine abuse, uncomplicated: Secondary | ICD-10-CM | POA: Diagnosis not present

## 2022-03-03 DIAGNOSIS — G4733 Obstructive sleep apnea (adult) (pediatric): Secondary | ICD-10-CM | POA: Diagnosis not present

## 2022-03-03 LAB — BASIC METABOLIC PANEL
Anion gap: 8 (ref 5–15)
BUN: 21 mg/dL — ABNORMAL HIGH (ref 6–20)
CO2: 30 mmol/L (ref 22–32)
Calcium: 8.3 mg/dL — ABNORMAL LOW (ref 8.9–10.3)
Chloride: 102 mmol/L (ref 98–111)
Creatinine, Ser: 1.01 mg/dL (ref 0.61–1.24)
GFR, Estimated: >60 mL/min (ref >60)
Glucose, Bld: 199 mg/dL — ABNORMAL HIGH (ref 70–99)
Potassium: 3.2 mmol/L — ABNORMAL LOW (ref 3.5–5.1)
Sodium: 140 mmol/L (ref 135–145)

## 2022-03-03 LAB — GLUCOSE, CAPILLARY
Glucose-Capillary: 238 mg/dL — ABNORMAL HIGH (ref 70–99)
Glucose-Capillary: 287 mg/dL — ABNORMAL HIGH (ref 70–99)
Glucose-Capillary: 231 mg/dL — ABNORMAL HIGH (ref 70–99)

## 2022-03-03 LAB — HIV ANTIBODY (ROUTINE TESTING W REFLEX): HIV Screen 4th Generation wRfx: NONREACTIVE

## 2022-03-03 MED ORDER — INSULIN ASPART 100 UNIT/ML IJ SOLN
0.0000 [IU] | Freq: Three times a day (TID) | INTRAMUSCULAR | Status: DC
  Administered 2022-03-04: 7 [IU] via SUBCUTANEOUS
  Administered 2022-03-04: 4 [IU] via SUBCUTANEOUS
  Filled 2022-03-03 (×2): qty 1

## 2022-03-03 MED ORDER — ROPINIROLE HCL 0.25 MG PO TABS: 0.2500 mg | ORAL_TABLET | Freq: Every day | ORAL | Status: DC

## 2022-03-03 MED ORDER — PREGABALIN 50 MG PO CAPS
50.0000 mg | ORAL_CAPSULE | Freq: Every day | ORAL | Status: DC
  Administered 2022-03-04: 50 mg via ORAL
  Filled 2022-03-03: qty 1

## 2022-03-03 MED ORDER — INSULIN ASPART 100 UNIT/ML IJ SOLN
0.0000 [IU] | Freq: Every day | INTRAMUSCULAR | Status: DC
  Administered 2022-03-03: 2 [IU] via SUBCUTANEOUS
  Filled 2022-03-03: qty 1

## 2022-03-03 MED ORDER — PREGABALIN 50 MG PO CAPS
100.0000 mg | ORAL_CAPSULE | Freq: Every day | ORAL | Status: DC
  Administered 2022-03-03: 100 mg via ORAL
  Filled 2022-03-03: qty 2

## 2022-03-03 MED ORDER — POTASSIUM CHLORIDE CRYS ER 20 MEQ PO TBCR
40.0000 meq | EXTENDED_RELEASE_TABLET | Freq: Once | ORAL | Status: AC
  Administered 2022-03-03: 40 meq via ORAL
  Filled 2022-03-03: qty 2

## 2022-03-03 MED ORDER — ROPINIROLE HCL 1 MG PO TABS
0.5000 mg | ORAL_TABLET | Freq: Every day | ORAL | Status: DC
  Administered 2022-03-03: 0.5 mg via ORAL
  Filled 2022-03-03: qty 1

## 2022-03-03 NOTE — Progress Notes (Signed)
[facility] Progress Note  Patient: Kevin Garrett    GEX:[id number]  DOA: 03/01/2022    Date of Service: the patient was seen and examined on 03/03/2022  Brief hospital course: 46 year old male with past medical history of morbid obesity, cocaine abuse, medical noncompliance with medications and CPAP, obstructive sleep apnea and hypertension plus diabetes who presented to the emergency room on 12/20 with lethargy, confusion and involuntary jerking movements.  According to patient's daughter, he was in his usual state of health 3 days prior, but notes that he falls asleep very easily.  According to patient's girlfriend, he last used cocaine 2 to 3 days prior.  In the emergency room, patient noted to be mildly tachypneic with respiratory rate of 28 and blood pressure with systolic as high in the 010U and diastolic as high as in the 110s.  BNP minimally elevated and ABG noted no significant hypercapnia.  Patient once given 1 dose of Lasix and diuresed almost a full liter.  Patient seen by neurology for confusion and involuntary leg movements and felt to be secondary to severe restless leg syndrome and uncontrolled sleep apnea.  EEG was attempted, but patient was at times aggressive and verbally abusive, refusing testing.  By 12/22, patient starting to be a little more awake.  CBGs slowly improving.   Assessment and Plan: * Acute metabolic encephalopathy-resolved as of 03/03/2022 Improving as he is starting to wake more and receive CPAP.  No evidence of hypercapnia, MRI unremarkable for CVA.  Patient refusing EEG, but part of this is felt to be secondary to severe uncontrolled sleep apnea.     Acute diastolic CHF (congestive heart failure) ([place]) Patient with shortness of breath and use of accessory muscles.  Started on IV Lasix.  2D echo results notes grade 2 diastolic dysfunction.  Chest x-ray notes cardiomegaly.  Hypertensive urgency Most likely secondary to medication noncompliance, cocaine  use and acute heart failure.  Improved after Lasix, continue Norvasc and lisinopril.  Try to avoid beta-blockers given cocaine use  Restless leg syndrome Quite severe.  Neurology following.  They have stopped his Requip and increasing Lyrica  Depression Continue Sertraline  Obesity, Class III, BMI 40-49.9 (morbid obesity) (HCC) BMI 41 Complicates overall prognosis and care  Nicotine dependence Will discuss smoking cessation with patient once mental status improves Place patient on a nicotine transdermal patch  Sleep apnea Noncompliant with CPAP at home, have started CPAP at bedtime will see if we can arrange for new CPAP at home  Diabetes mellitus without complication ([place]) Hold metformin.  Sliding scale for now.  notes uncontrolled sugars with A1c at 8.6.  Increase sliding scale coverage       Body mass index is 40.17 kg/m.        Consultants: Neurology  Procedures: Echocardiogram: Grade 2 diastolic dysfunction  Antimicrobials: None  Code Status: Full code   Subjective: Patient occasionally drifts off to sleep, but more awake and alert,  Objective: Mildly elevated blood pressures, much improved from admission Vitals:   03/03/22 0932 03/03/22 1059  BP: (!) 131/98 117/83  Pulse: 76 72  Resp:  20  Temp: 98.2 F (36.8 C) 98.6 F (37 C)  SpO2: 100% 97%    Intake/Output Summary (Last 24 hours) at 03/03/2022 1811 Last data filed at 03/03/2022 1033 Gross per 24 hour  Intake 360 ml  Output --  Net 360 ml   Filed Weights   03/01/22 0646 03/03/22 0445 03/03/22 0932  Weight: 127 kg 123.6 kg 123.4 kg  Body mass index is 40.17 kg/m.  Exam:  General: Oriented x 3, no acute distress HEENT: Normocephalic, atraumatic, mucous membranes are moist, narrow airway Cardiovascular: Regular rate and rhythm, S1-S2 Respiratory: Decreased breath sounds throughout secondary to body habitus Abdomen: Soft, obese, nontender, positive bowel sounds Musculoskeletal: No  clubbing or cyanosis, trace pitting edema Skin: No skin breaks, tears or lesions Psychiatry: Appropriate, no evidence of psychoses Neurology: No focal deficits, jerky lower extremity movement due to restless leg syndrome  Data Reviewed: A1c of 8.6.  Creatinine at 1.01.  Potassium 3.2.  Disposition:  Status is: Inpatient Remains inpatient appropriate because:  -Continue treatment with CPAP, setting up home CPAP    Anticipated discharge date: 12/23  Family Communication: Will call daughter DVT Prophylaxis: Lovenox    Author: Annita Brod ,MD 03/03/2022 6:11 PM  To reach On-call, see care teams to locate the attending and reach out via [email or web address]. Between 7PM-7AM, please contact night-coverage If you still have difficulty reaching the attending provider, please page the [facility] (Director on Call) for [facility] on amion for assistance.

## 2022-03-03 NOTE — Assessment & Plan Note (Signed)
Quite severe.  Neurology following.  They have stopped his Requip and increasing Lyrica

## 2022-03-03 NOTE — Care Management Important Message (Signed)
Important Message  Patient Details  Name: KORDELL JAFRI MRN: [id number] Date of Birth: [birth year]   Medicare Important Message Given:  N/A - LOS <3 / Initial given by admissions     Juliann Pulse A Caliah Kopke 03/03/2022, 8:22 AM
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
## 2022-03-04 DIAGNOSIS — I5031 Acute diastolic (congestive) heart failure: Secondary | ICD-10-CM | POA: Diagnosis not present

## 2022-03-04 DIAGNOSIS — G9341 Metabolic encephalopathy: Secondary | ICD-10-CM | POA: Diagnosis not present

## 2022-03-04 DIAGNOSIS — I160 Hypertensive urgency: Secondary | ICD-10-CM | POA: Diagnosis not present

## 2022-03-04 LAB — GLUCOSE, CAPILLARY
Glucose-Capillary: 236 mg/dL — ABNORMAL HIGH (ref 70–99)
Glucose-Capillary: 168 mg/dL — ABNORMAL HIGH (ref 70–99)

## 2022-03-04 LAB — BASIC METABOLIC PANEL
Anion gap: 8 (ref 5–15)
BUN: 22 mg/dL — ABNORMAL HIGH (ref 6–20)
CO2: 30 mmol/L (ref 22–32)
Calcium: 8.5 mg/dL — ABNORMAL LOW (ref 8.9–10.3)
Chloride: 102 mmol/L (ref 98–111)
Creatinine, Ser: 0.98 mg/dL (ref 0.61–1.24)
GFR, Estimated: >60 mL/min (ref >60)
Glucose, Bld: 190 mg/dL — ABNORMAL HIGH (ref 70–99)
Potassium: 3.6 mmol/L (ref 3.5–5.1)
Sodium: 140 mmol/L (ref 135–145)

## 2022-03-04 MED ORDER — GLIPIZIDE 2.5 MG PO TABS: 2.5000 mg | ORAL_TABLET | Freq: Every day | ORAL | 2 refills | Status: AC

## 2022-03-04 MED ORDER — GLIPIZIDE 5 MG PO TABS: 2.5000 mg | ORAL_TABLET | Freq: Every day | ORAL | Status: DC

## 2022-03-04 MED ORDER — ROPINIROLE HCL 0.25 MG PO TABS: ORAL_TABLET | ORAL | 0 refills | Status: AC

## 2022-03-04 MED ORDER — PREGABALIN 50 MG PO CAPS: ORAL_CAPSULE | ORAL | 1 refills | Status: AC

## 2022-03-04 MED ORDER — POTASSIUM CHLORIDE CRYS ER 20 MEQ PO TBCR
40.0000 meq | EXTENDED_RELEASE_TABLET | Freq: Once | ORAL | Status: AC
  Administered 2022-03-04: 40 meq via ORAL
  Filled 2022-03-04: qty 2

## 2022-03-04 MED ORDER — ENOXAPARIN SODIUM 60 MG/0.6ML IJ SOSY: 60.0000 mg | PREFILLED_SYRINGE | INTRAMUSCULAR | Status: DC

## 2022-03-04 NOTE — Progress Notes (Signed)
Discharge instructions and med list reviewed with pt and daughter who verbalized understanding.  Pt to call PCP for F/U appt.  Tele and IV removed.  Pt Dc'd via wheelchair with staff RN without incident

## 2022-03-04 NOTE — Discharge Summary (Signed)
Physician Discharge Summary   Patient: Kevin Garrett MRN: [id number] DOB: January 06, 1976  Admit date:     03/01/2022  Discharge date: 03/04/22  Discharge Physician: Annita Brod   PCP: [facility]   Recommendations at discharge:   New medication: Glipizide 2.5 p.o. daily Medication change: Neurontin discontinued Patient set up with home health including CPAP equipment New medication: Lyrica 50 mg in morning and 100 mg in evening New medication: Requip taper.  0.5 mg nightly x 2 days, then 0.25 mg nightly x 3 days then stop  Discharge Diagnoses: Active Problems:   Diabetes mellitus without complication (HCC)   Sleep apnea   Nicotine dependence   Obesity, Class III, BMI 40-49.9 (morbid obesity) (HCC)   Depression   Restless leg syndrome  Principal Problem (Resolved):   Acute metabolic encephalopathy Resolved Problems:   Acute diastolic CHF (congestive heart failure) (HCC)   Hypertensive urgency  Hospital Course: 46 year old male with past medical history of morbid obesity, cocaine abuse, medical noncompliance with medications and CPAP, obstructive sleep apnea and hypertension plus diabetes who presented to the emergency room on 12/20 with lethargy, confusion and involuntary jerking movements.  According to patient's daughter, he was in his usual state of health 3 days prior, but notes that he falls asleep very easily.  According to patient's girlfriend, he last used cocaine 2 to 3 days prior.  In the emergency room, patient noted to be mildly tachypneic with respiratory rate of 28 and blood pressure with systolic as high in the 924Q and diastolic as high as in the 110s.  BNP minimally elevated and ABG noted no significant hypercapnia.  Patient once given 1 dose of Lasix and diuresed almost a full liter.  Patient seen by neurology for confusion and involuntary leg movements and felt to be secondary to severe restless leg syndrome and uncontrolled sleep apnea.  EEG  was attempted, but patient was at times aggressive and verbally abusive, refusing testing.  By 12/22, patient starting to be a little more awake.  CBGs slowly improving.  By 12/23, patient much improved.  Seen by neurology who changed Neurontin which was extra sedating over to Lyrica which patient seem to respond to.  Assessment and Plan: * Acute metabolic encephalopathy-resolved as of 03/03/2022 Improving as he is starting to wake more and receive CPAP.  No evidence of hypercapnia, MRI unremarkable for CVA.  Patient refusing EEG, but part of this is felt to be secondary to severe uncontrolled sleep apnea.     Acute diastolic CHF (congestive heart failure) (HCC)-resolved as of 03/04/2022 Patient with shortness of breath and use of accessory muscles.  Started on IV Lasix.  2D echo results notes grade 2 diastolic dysfunction.  Chest x-ray notes cardiomegaly.  By day of discharge, had diuresed almost 4 L and was -2.3 L deficient  Hypertensive urgency-resolved as of 03/04/2022 Most likely secondary to medication noncompliance, cocaine use and acute heart failure.  Resolved after Lasix, continue Norvasc and lisinopril.  Try to avoid beta-blockers given cocaine use  Restless leg syndrome Quite severe.  Neurology following.  They have stopped his Neurontin due to oversedation and changed to Lyrica as well as Requip taper  Depression Continue Sertraline  Obesity, Class III, BMI 40-49.9 (morbid obesity) (HCC) BMI 41 Complicates overall prognosis and care  Nicotine dependence Will discuss smoking cessation with patient once mental status improves Place patient on a nicotine transdermal patch  Sleep apnea Noncompliant with CPAP at home, have started CPAP at bedtime home  health set up to get new equipment for patient  Diabetes mellitus without complication ([place]) Hold metformin.  Sliding scale for now.  notes uncontrolled sugars with A1c at 8.6.  Increase sliding scale coverage.  Added glipizide  2.5 p.o. daily        Pain control - [place] Controlled Substance Reporting System database was reviewed. and patient was instructed, not to drive, operate heavy machinery, perform activities at heights, swimming or participation in water activities or provide baby-sitting services while on Pain, Sleep and Anxiety Medications; until their outpatient Physician has advised to do so again. Also recommended to not to take more than prescribed Pain, Sleep and Anxiety Medications.  Consultants: Neurology Procedures performed: Echocardiogram currently grade 2 diastolic dysfunction Disposition: Home with home health Diet recommendation:  Discharge Diet Orders (From admission, onward)     Start     Ordered   03/04/22 0000  Diet - low sodium heart healthy        03/04/22 1407           Cardiac and Carb modified diet DISCHARGE MEDICATION: Allergies as of 03/04/2022   No Known Allergies      Medication List     STOP taking these medications    gabapentin 300 MG capsule Commonly known as: NEURONTIN       TAKE these medications    amLODipine 10 MG tablet Commonly known as: NORVASC Take 10 mg by mouth daily.   chlorthalidone 25 MG tablet Commonly known as: HYGROTON Take 25 mg by mouth daily.   citalopram 10 MG tablet Commonly known as: CELEXA Take 3 tablets (30 mg total) by mouth daily.   glipiZIDE 2.5 MG Tabs Take 2.5 mg by mouth daily before breakfast. Start taking on: March 05, 2022   Jardiance 25 MG Tabs tablet Generic drug: empagliflozin Take 25 mg by mouth daily.   lisinopril 20 MG tablet Commonly known as: ZESTRIL Take 20 mg by mouth daily.   meloxicam 15 MG tablet Commonly known as: MOBIC Take 1 tablet (15 mg total) by mouth daily.   metFORMIN 1000 MG tablet Commonly known as: GLUCOPHAGE Take 1,000 mg by mouth 2 (two) times daily.   pregabalin 50 MG capsule Commonly known as: LYRICA Take 50 mg (1 capsule) in morning and 100 mg (2  capsules) at night   rOPINIRole 0.25 MG tablet Commonly known as: REQUIP Take 2 tablets (0.5 mg total) by mouth at bedtime for 2 days, THEN 1 tablet (0.25 mg total) at bedtime for 3 days. Start taking on: March 04, 2022   sertraline 100 MG tablet Commonly known as: ZOLOFT Take 200 mg by mouth daily.   traMADol 50 MG tablet Commonly known as: Ultram Take 1 tablet (50 mg total) by mouth every 12 (twelve) hours as needed.               Durable Medical Equipment  (From admission, onward)           Start     Ordered   03/04/22 0954  For home use only DME Other see comment  Once       Comments: CPAP supplies, tubing, mask, cushions, headgear  Question:  Length of Need  Answer:  Lifetime   03/04/22 0953            Discharge Exam: Filed Weights   03/01/22 0646 03/03/22 0445 03/03/22 0932  Weight: 127 kg 123.6 kg 123.4 kg   General: Alert and oriented x 3, no acute distress  Cardiovascular: Regular rate and rhythm, S1-S2 Lungs: Clear to auscultation bilaterally  Condition at discharge: good  The results of significant diagnostics from this hospitalization (including imaging, microbiology, ancillary and laboratory) are listed below for reference.   Imaging Studies: ECHOCARDIOGRAM COMPLETE  Result Date: 03/02/2022    ECHOCARDIOGRAM REPORT   Patient Name:   [name] Date of Exam: 03/02/2022 Medical Rec #:  [id number]      Height:       69.0 in Accession #:    [id number]     Weight:       280.0 lb Date of Birth:  Apr 16, 1975      BSA:          2.384 m Patient Age:    60 years       BP:           116/73 mmHg Patient Gender: M              HR:           78 bpm. Exam Location:  [facility] Procedure: 2D Echo, Color Doppler and Cardiac Doppler Indications:     I50.31 congestive heart failure-Acute Diastolic  History:         Patient has no prior history of Echocardiogram examinations.                  Risk Factors:Hypertension, Diabetes, Sleep Apnea and Current                   Smoker.  Sonographer:     Charmayne Sheer Referring Phys:  [id number] Diagnosing Phys: Ida Rogue MD  Sonographer Comments: Technically difficult study due to poor echo windows. Image acquisition challenging due to uncooperative patient, Image acquisition challenging due to respiratory motion and Image acquisition challenging due to patient body habitus. IMPRESSIONS  1. Left ventricular ejection fraction, by estimation, is 55 to 60%. The left ventricle has normal function. The left ventricle has no regional wall motion abnormalities. Left ventricular diastolic parameters are consistent with Grade II diastolic dysfunction (pseudonormalization).  2. Right ventricular systolic function is normal. The right ventricular size is normal. Tricuspid regurgitation signal is inadequate for assessing PA pressure.  3. The mitral valve is normal in structure. Trivial mitral valve regurgitation. No evidence of mitral stenosis.  4. The aortic valve was not well visualized. Aortic valve regurgitation is not visualized. No aortic stenosis is present.  5. The inferior vena cava is normal in size with greater than 50% respiratory variability, suggesting right atrial pressure of 3 mmHg. FINDINGS  Left Ventricle: Left ventricular ejection fraction, by estimation, is 55 to 60%. The left ventricle has normal function. The left ventricle has no regional wall motion abnormalities. The left ventricular internal cavity size was normal in size. There is  no left ventricular hypertrophy. Left ventricular diastolic parameters are consistent with Grade II diastolic dysfunction (pseudonormalization). Right Ventricle: The right ventricular size is normal. No increase in right ventricular wall thickness. Right ventricular systolic function is normal. Tricuspid regurgitation signal is inadequate for assessing PA pressure. Left Atrium: Left atrial size was normal in size. Right Atrium: Right atrial size was normal in size. Pericardium:  There is no evidence of pericardial effusion. Mitral Valve: The mitral valve is normal in structure. There is mild calcification of the mitral valve leaflet(s). Mild mitral annular calcification. Trivial mitral valve regurgitation. No evidence of mitral valve stenosis. Tricuspid Valve: The tricuspid valve is normal in structure. Tricuspid valve regurgitation is not  demonstrated. No evidence of tricuspid stenosis. Aortic Valve: The aortic valve was not well visualized. Aortic valve regurgitation is not visualized. No aortic stenosis is present. Aortic valve mean gradient measures 3.0 mmHg. Aortic valve peak gradient measures 6.2 mmHg. Aortic valve area, by VTI measures 2.48 cm. Pulmonic Valve: The pulmonic valve was normal in structure. Pulmonic valve regurgitation is not visualized. No evidence of pulmonic stenosis. Aorta: The aortic root is normal in size and structure. Venous: The inferior vena cava is normal in size with greater than 50% respiratory variability, suggesting right atrial pressure of 3 mmHg. IAS/Shunts: No atrial level shunt detected by color flow Doppler.  LEFT VENTRICLE PLAX 2D LVIDd:         4.10 cm   Diastology LVIDs:         3.10 cm   LV e' medial:    7.07 cm/s LV PW:         1.60 cm   LV E/e' medial:  11.3 LV IVS:        1.00 cm   LV e' lateral:   12.30 cm/s LVOT diam:     2.10 cm   LV E/e' lateral: 6.5 LV SV:         51 LV SV Index:   21 LVOT Area:     3.46 cm  LEFT ATRIUM             Index LA diam:        3.80 cm 1.59 cm/m LA Vol (A2C):   56.2 ml 23.58 ml/m LA Vol (A4C):   31.9 ml 13.38 ml/m LA Biplane Vol: 43.3 ml 18.17 ml/m  AORTIC VALVE                    PULMONIC VALVE AV Area (Vmax):    2.77 cm     PV Vmax:       1.42 m/s AV Area (Vmean):   2.90 cm     PV Vmean:      99.300 cm/s AV Area (VTI):     2.48 cm     PV VTI:        0.222 m AV Vmax:           124.00 cm/s  PV Peak grad:  8.1 mmHg AV Vmean:          85.100 cm/s  PV Mean grad:  5.0 mmHg AV VTI:            0.204 m AV Peak  Grad:      6.2 mmHg AV Mean Grad:      3.0 mmHg LVOT Vmax:         99.00 cm/s LVOT Vmean:        71.200 cm/s LVOT VTI:          0.146 m LVOT/AV VTI ratio: 0.72  AORTA Ao Root diam: 3.50 cm MITRAL VALVE MV Area (PHT): 3.59 cm    SHUNTS MV Decel Time: 212 msec    Systemic VTI:  0.15 m MV E velocity: 80.20 cm/s  Systemic Diam: 2.10 cm MV A velocity: 55.30 cm/s MV E/A ratio:  1.45 Ida Rogue MD Electronically signed by Ida Rogue MD Signature Date/Time: 03/02/2022/5:29:14 PM    Final    MR BRAIN WO CONTRAST  Result Date: 03/01/2022 CLINICAL DATA:  Acute neuro deficit. Confusion. Involuntary movements. EXAM: MRI HEAD WITHOUT CONTRAST TECHNIQUE: Multiplanar, multiecho pulse sequences of the brain and surrounding structures were obtained without intravenous contrast. COMPARISON:  CT head 03/01/2022 FINDINGS: Brain: No acute infarction, hemorrhage, hydrocephalus, extra-axial collection or mass lesion. Normal white matter. Motion degraded study.  Suboptimal image quality due to motion. Vascular: Normal arterial flow voids Skull and upper cervical spine: No focal skeletal abnormality. Sinuses/Orbits: Paranasal sinuses clear.  Negative orbit Other: None IMPRESSION: Negative MRI head. Motion degraded study. Electronically Signed   By: Franchot Gallo M.D.   On: 03/01/2022 13:59   CT Angio Chest PE W and/or Wo Contrast  Result Date: 03/01/2022 CLINICAL DATA:  SOB EXAM: CT ANGIOGRAPHY CHEST WITH CONTRAST TECHNIQUE: Multidetector CT imaging of the chest was performed using the standard protocol during bolus administration of intravenous contrast. Multiplanar CT image reconstructions and MIPs were obtained to evaluate the vascular anatomy. RADIATION DOSE REDUCTION: This exam was performed according to the departmental dose-optimization program which includes automated exposure control, adjustment of the mA and/or kV according to patient size and/or use of iterative reconstruction technique. CONTRAST:  75 mL  OMNIPAQUE IOHEXOL 350 MG/ML SOLN COMPARISON:  10/15/2016. FINDINGS: Cardiovascular: Satisfactory opacification of the pulmonary arteries to the segmental level. No evidence of pulmonary embolism. Normal heart size. No pericardial effusion. Mediastinum/Nodes: No enlarged mediastinal, hilar, or axillary lymph nodes. Thyroid gland, trachea, and esophagus demonstrate no significant findings. Lungs/Pleura: Diffuse ground-glass opacities without focal consolidation. Findings consistent with atypical infection versus pneumonitis or edema. No pneumothorax or pleural effusion. Upper Abdomen: No acute abnormality. Musculoskeletal: Chronic posttraumatic changes of the left scapula. No acute osseous abnormalities are identified. Review of the MIP images confirms the above findings. IMPRESSION: 1. No evidence of PE, aneurysm or dissection. 2. Diffuse ground-glass opacities throughout lungs consistent with pneumonitis, atypical infection or edema. Electronically Signed   By: Sammie Bench M.D.   On: 03/01/2022 09:41   CT HEAD WO CONTRAST (5MM)  Result Date: 03/01/2022 CLINICAL DATA:  Mental status change, unknown cause EXAM: CT HEAD WITHOUT CONTRAST TECHNIQUE: Contiguous axial images were obtained from the base of the skull through the vertex without intravenous contrast. RADIATION DOSE REDUCTION: This exam was performed according to the departmental dose-optimization program which includes automated exposure control, adjustment of the mA and/or kV according to patient size and/or use of iterative reconstruction technique. COMPARISON:  04/28/2018 FINDINGS: Brain: No evidence of acute infarction, hemorrhage, hydrocephalus, extra-axial collection or mass lesion/mass effect. Vascular: No hyperdense vessel or unexpected calcification. Skull: Normal. Negative for fracture or focal lesion. Sinuses/Orbits: No acute finding. IMPRESSION: No acute intracranial process. Electronically Signed   By: Sammie Bench M.D.   On:  03/01/2022 09:34   DG Chest 2 View  Result Date: 03/01/2022 CLINICAL DATA:  Shortness of breath EXAM: CHEST - 2 VIEW COMPARISON:  10/14/2016 FINDINGS: Cardiomegaly accentuated by low volumes. Negative aortic and hilar contours. There is no edema, consolidation, effusion, or pneumothorax. IMPRESSION: Cardiomegaly accentuated by low volume chest. No focal or acute finding. Electronically Signed   By: Jorje Guild M.D.   On: 03/01/2022 07:40    Microbiology: Results for orders placed or performed during the hospital encounter of 03/01/22  Resp panel by RT-PCR (RSV, Flu A&B, Covid) Anterior Nasal Swab     Status: None   Collection Time: 03/01/22  6:55 AM   Specimen: Anterior Nasal Swab  Result Value Ref Range Status   SARS Coronavirus 2 by RT PCR NEGATIVE NEGATIVE Final    Comment: (NOTE) SARS-CoV-2 target nucleic acids are NOT DETECTED.  The SARS-CoV-2 RNA is generally detectable in upper respiratory specimens during the acute phase of infection. The lowest concentration  of SARS-CoV-2 viral copies this assay can detect is 138 copies/mL. A negative result does not preclude SARS-Cov-2 infection and should not be used as the sole basis for treatment or other patient management decisions. A negative result may occur with  improper specimen collection/handling, submission of specimen other than nasopharyngeal swab, presence of viral mutation(s) within the areas targeted by this assay, and inadequate number of viral copies(<138 copies/mL). A negative result must be combined with clinical observations, patient history, and epidemiological information. The expected result is Negative.  Fact Sheet for Patients:  EntrepreneurPulse.com.au  Fact Sheet for Healthcare Providers:  IncredibleEmployment.be  This test is no t yet approved or cleared by the [place] FDA and  has been authorized for detection and/or diagnosis of SARS-CoV-2 by FDA under an  Emergency Use Authorization (EUA). This EUA will remain  in effect (meaning this test can be used) for the duration of the COVID-19 declaration under Section 564(b)(1) of the Act, 21 U.S.C.section 360bbb-3(b)(1), unless the authorization is terminated  or revoked sooner.       Influenza A by PCR NEGATIVE NEGATIVE Final   Influenza B by PCR NEGATIVE NEGATIVE Final    Comment: (NOTE) The Xpert Xpress SARS-CoV-2/FLU/RSV plus assay is intended as an aid in the diagnosis of influenza from Nasopharyngeal swab specimens and should not be used as a sole basis for treatment. Nasal washings and aspirates are unacceptable for Xpert Xpress SARS-CoV-2/FLU/RSV testing.  Fact Sheet for Patients: EntrepreneurPulse.com.au  Fact Sheet for Healthcare Providers: IncredibleEmployment.be  This test is not yet approved or cleared by the [place] FDA and has been authorized for detection and/or diagnosis of SARS-CoV-2 by FDA under an Emergency Use Authorization (EUA). This EUA will remain in effect (meaning this test can be used) for the duration of the COVID-19 declaration under Section 564(b)(1) of the Act, 21 U.S.C. section 360bbb-3(b)(1), unless the authorization is terminated or revoked.     Resp Syncytial Virus by PCR NEGATIVE NEGATIVE Final    Comment: (NOTE) Fact Sheet for Patients: EntrepreneurPulse.com.au  Fact Sheet for Healthcare Providers: IncredibleEmployment.be  This test is not yet approved or cleared by the [place] FDA and has been authorized for detection and/or diagnosis of SARS-CoV-2 by FDA under an Emergency Use Authorization (EUA). This EUA will remain in effect (meaning this test can be used) for the duration of the COVID-19 declaration under Section 564(b)(1) of the Act, 21 U.S.C. section 360bbb-3(b)(1), unless the authorization is terminated or revoked.  Performed at [facility], [address] [place], [place] [address]     Labs: CBC: Recent Labs  Lab 03/01/22 0655 03/02/22 0630  WBC 7.2 10.5  NEUTROABS 4.6  --   HGB 15.1 14.6  HCT 44.9 42.6  MCV 84.9 83.7  PLT 198 250   Basic Metabolic Panel: Recent Labs  Lab 03/01/22 0655 03/02/22 0630 03/03/22 0709 03/04/22 0511  NA 137 140 140 140  K 3.5 3.6 3.2* 3.6  CL 104 103 102 102  CO2 '29 28 30 30  '$ GLUCOSE 240* 236* 199* 190*  BUN 15 25* 21* 22*  CREATININE 0.92 1.17 1.01 0.98  CALCIUM 9.0 8.5* 8.3* 8.5*   Liver Function Tests: Recent Labs  Lab 03/01/22 0655  AST 24  ALT 28  ALKPHOS 61  BILITOT 0.9  PROT 8.3*  ALBUMIN 4.1   CBG: Recent Labs  Lab 03/03/22 1250 03/03/22 1720 03/03/22 2138 03/04/22 0735 03/04/22 1143  GLUCAP 238* 287* 231* 236* 168*    Discharge  time spent: less than 30 minutes.  Signed: Annita Brod, MD [facility] 03/04/2022

## 2022-03-04 NOTE — TOC Initial Note (Signed)
Transition of Care [facility]) - Initial/Assessment Note    Patient Details  Name: Kevin Garrett MRN: [id number] Date of Birth: 1975/04/28  Transition of Care [facility]) CM/SW Contact:    Valente David, RN Phone Number: 03/04/2022, 9:55 AM  Clinical Narrative:                  Notified by provider plan to discharge home today and patient in need of CPAP.  Patient attends [facility] for Primary care.  State he has CPAP at home, in need of medical supplies (tubing, masks, etc).  Spoke with Erasmo Downer with [employer], order requested for home CPAP supplies.  Patient also provided with number to call [employer] [phone]) for issues of if machine need to be services.  He denies further questions, TOC remains available if needed.   Expected Discharge Plan: Home/Self Care Barriers to Discharge: Continued Medical Work up   Patient Goals and CMS Choice Patient states their goals for this hospitalization and ongoing recovery are:: Home          Expected Discharge Plan and Services       Living arrangements for the past 2 months: Single Family Home                 DME Arranged: Other see comment (CPAP supplies) DME Agency: [employer] Date DME Agency Contacted: 03/04/22 Time DME Agency Contacted: [phone] Representative spoke with at DME Agency: Erasmo Downer            Prior Living Arrangements/Services Living arrangements for the past 2 months: [place] Lives with:: Self          Need for Family Participation in Patient Care: Yes (Comment) Care giver support system in place?: Yes (comment) Current home services: DME Criminal Activity/Legal Involvement Pertinent to Current Situation/Hospitalization: No - Comment as needed  Activities of Daily Living Home Assistive Devices/Equipment: CPAP ADL Screening (condition at time of admission) Patient's cognitive ability adequate to safely complete daily activities?: Yes Is the patient deaf or have difficulty hearing?: No Does the  patient have difficulty seeing, even when wearing glasses/contacts?: No Does the patient have difficulty concentrating, remembering, or making decisions?: No Patient able to express need for assistance with ADLs?: Yes Does the patient have difficulty dressing or bathing?: No Independently performs ADLs?: Yes (appropriate for developmental age) Does the patient have difficulty walking or climbing stairs?: No Weakness of Legs: Right Weakness of Arms/Hands: None  Permission Sought/Granted   Permission granted to share information with : Yes, Verbal Permission Granted              Emotional Assessment   Attitude/Demeanor/Rapport: Engaged Affect (typically observed): Appropriate Orientation: : Oriented to Self, Oriented to Place, Oriented to  Time, Oriented to Situation   Psych Involvement: No (comment)  Admission diagnosis:  Altered mental status, unspecified altered mental status type [U63.33] Acute metabolic encephalopathy [L45.62] Patient Active Problem List   Diagnosis Date Noted   Restless leg syndrome 03/03/2022   Hypertensive urgency 56/38/9373   Acute diastolic CHF (congestive heart failure) ([place]) 03/01/2022   Obesity, Class III, BMI 40-49.9 (morbid obesity) ([place]) 03/01/2022   Depression 03/01/2022   Nicotine dependence 10/18/2016   Vitamin D deficiency 10/18/2016   Hypertension    Diabetes mellitus without complication ([place])    Sleep apnea    Closed left scapular fracture 10/15/2016   Closed displaced segmental fracture of shaft of left humerus 10/15/2016   PCP:  [place]:   [facility] -  [place], [place] [address] Phone: [phone] Fax: [phone]  [id number] [place] [id number]: [phone]: [phone]    Social Determinants of Health (SDOH) Social History: [place] No Food Insecurity (03/02/2022)  Housing: Low Risk  (03/02/2022)  Transportation Needs: Unmet Transportation Needs (03/02/2022)  Utilities: Not At Risk (03/02/2022)  Tobacco Use: Medium Risk (03/02/2022)   SDOH Interventions:     Readmission Risk Interventions     No data to display

## 2022-03-18 ENCOUNTER — Emergency Department
Admission: EM | Admit: 2022-03-18 | Discharge: 2022-03-18 | Disposition: A | Discharge status: Home / Self Care | Payer: Medicare Other | Attending: Emergency Medicine | Admitting: Emergency Medicine

## 2022-03-18 ENCOUNTER — Emergency Department: Payer: Medicare Other

## 2022-03-18 ENCOUNTER — Other Ambulatory Visit: Payer: Self-pay

## 2022-03-18 DIAGNOSIS — R1084 Generalized abdominal pain: Secondary | ICD-10-CM | POA: Diagnosis not present

## 2022-03-18 DIAGNOSIS — E119 Type 2 diabetes mellitus without complications: Secondary | ICD-10-CM | POA: Insufficient documentation

## 2022-03-18 DIAGNOSIS — I10 Essential (primary) hypertension: Secondary | ICD-10-CM | POA: Insufficient documentation

## 2022-03-18 DIAGNOSIS — R109 Unspecified abdominal pain: Secondary | ICD-10-CM

## 2022-03-18 LAB — URINALYSIS, ROUTINE W REFLEX MICROSCOPIC
Bacteria, UA: NONE SEEN
Bilirubin Urine: NEGATIVE
Glucose, UA: >=500 mg/dL — AB
Hgb urine dipstick: NEGATIVE
Ketones, ur: NEGATIVE mg/dL
Leukocytes,Ua: NEGATIVE
Nitrite: NEGATIVE
Protein, ur: NEGATIVE mg/dL
Specific Gravity, Urine: 1.014 (ref 1.005–1.030)
pH: 5 (ref 5.0–8.0)

## 2022-03-18 LAB — COMPREHENSIVE METABOLIC PANEL
ALT: 23 U/L (ref 0–44)
AST: 19 U/L (ref 15–41)
Albumin: 4.2 g/dL (ref 3.5–5.0)
Alkaline Phosphatase: 49 U/L (ref 38–126)
Anion gap: 10 (ref 5–15)
BUN: 22 mg/dL — ABNORMAL HIGH (ref 6–20)
CO2: 27 mmol/L (ref 22–32)
Calcium: 8.8 mg/dL — ABNORMAL LOW (ref 8.9–10.3)
Chloride: 100 mmol/L (ref 98–111)
Creatinine, Ser: 1.31 mg/dL — ABNORMAL HIGH (ref 0.61–1.24)
GFR, Estimated: >60 mL/min (ref >60)
Glucose, Bld: 127 mg/dL — ABNORMAL HIGH (ref 70–99)
Potassium: 3.3 mmol/L — ABNORMAL LOW (ref 3.5–5.1)
Sodium: 137 mmol/L (ref 135–145)
Total Bilirubin: 1.2 mg/dL (ref 0.3–1.2)
Total Protein: 8 g/dL (ref 6.5–8.1)

## 2022-03-18 LAB — CBC
HCT: 42.2 % (ref 39.0–52.0)
Hemoglobin: 14.4 g/dL (ref 13.0–17.0)
MCH: 28.6 pg (ref 26.0–34.0)
MCHC: 34.1 g/dL (ref 30.0–36.0)
MCV: 83.7 fL (ref 80.0–100.0)
Platelets: 282 10*3/uL (ref 150–400)
RBC: 5.04 MIL/uL (ref 4.22–5.81)
RDW: 13.1 % (ref 11.5–15.5)
WBC: 6.1 10*3/uL (ref 4.0–10.5)
nRBC: 0 % (ref 0.0–0.2)

## 2022-03-18 LAB — LIPASE, BLOOD: Lipase: 38 U/L (ref 11–51)

## 2022-03-18 MED ORDER — FAMOTIDINE 20 MG PO TABS: 20.0000 mg | ORAL_TABLET | Freq: Two times a day (BID) | ORAL | 0 refills | Status: AC

## 2022-03-18 MED ORDER — IOHEXOL 300 MG/ML  SOLN
100.0000 mL | Freq: Once | INTRAMUSCULAR | Status: AC | PRN
  Administered 2022-03-18: 100 mL via INTRAVENOUS

## 2022-03-18 NOTE — ED Notes (Signed)
Pt A&Ox4. Pain stomach on R side and hasn't stopped since the last time he came here around christmas time. Has not been able to barely eating during this period but has been drinking fluids. States BS levels have been around 180s. Has not taking medications today. Had a BM last night but was constipated before that for a couple days. Denies any urinary issues. Also cannot sleep due to these issues.

## 2022-03-18 NOTE — ED Triage Notes (Signed)
Pt to ED via POV [facility] Pt reports right sided abdominal pain since he was seen on 12/20. Pt reports pain has been constant. Pt denies urinary symptoms. Pt reports mild nausea.

## 2022-03-18 NOTE — ED Provider Notes (Signed)
[facility] Provider Note    Event Date/Time   First MD Initiated Contact with Patient 03/18/22 [phone]     (approximate)   History   Abdominal Pain   HPI  Kevin Garrett is a 47 y.o. male with a past medical history of diabetes, sleep apnea who presents today for evaluation of abdominal pain.  Patient reports that it was intermittent since 12/20 but has become more constant over the last few days.  He is unsure of any exacerbating or relieving factors.  He denies nausea, vomiting, diarrhea.  He has not had any fevers or chills.  He denies any urinary symptoms.  Patient Active Problem List   Diagnosis Date Noted   Restless leg syndrome 03/03/2022   Obesity, Class III, BMI 40-49.9 (morbid obesity) ([place]) 03/01/2022   Depression 03/01/2022   Nicotine dependence 10/18/2016   Vitamin D deficiency 10/18/2016   Hypertension    Diabetes mellitus without complication ([facility])    Sleep apnea    Closed left scapular fracture 10/15/2016   Closed displaced segmental fracture of shaft of left humerus 10/15/2016         Physical Exam   Triage Vital Signs: ED Triage Vitals  Enc Vitals Group     BP 03/18/22 0715 (!) 147/101     Pulse Rate 03/18/22 0713 83     Resp 03/18/22 0713 18     Temp 03/18/22 0713 (!) 97.5 F (36.4 C)     Temp Source 03/18/22 0713 Oral     SpO2 03/18/22 0713 97 %     Weight 03/18/22 0710 271 lb 2.7 oz (123 kg)     Height 03/18/22 0710 '5\' 9"'$  (1.753 m)     Head Circumference --      Peak Flow --      Pain Score 03/18/22 0710 6     Pain Loc --      Pain Edu? --      Excl. in [place]? --     Most recent vital signs: Vitals:   03/18/22 0812 03/18/22 1105  BP: (!) 154/101   Pulse: 80   Resp: 20   Temp:  98 F (36.7 C)  SpO2: 97%     Physical Exam Vitals and nursing note reviewed.  Constitutional:      General: Awake and alert. No acute distress.    Appearance: Normal appearance. The patient is obese.  HENT:     Head:  Normocephalic and atraumatic.     Mouth: Mucous membranes are moist.  Eyes:     General: PERRL. Normal EOMs        Right eye: No discharge.        Left eye: No discharge.     Conjunctiva/sclera: Conjunctivae normal.  Cardiovascular:     Rate and Rhythm: Normal rate and regular rhythm.     Pulses: Normal pulses.  Pulmonary:     Effort: Pulmonary effort is normal. No respiratory distress.     Breath sounds: Normal breath sounds.  Abdominal:     Abdomen is soft. There is mild abdominal tenderness diffusely. No rebound or guarding. No distention. No CVAT Musculoskeletal:        General: No swelling. Normal range of motion.     Cervical back: Normal range of motion and neck supple.  Skin:    General: Skin is warm and dry.     Capillary Refill: Capillary refill takes less than 2 seconds.     Findings: No rash.  Neurological:     Mental Status: The patient is awake and alert.      ED Results / Procedures / Treatments   Labs (all labs ordered are listed, but only abnormal results are displayed) Labs Reviewed  COMPREHENSIVE METABOLIC PANEL - Abnormal; Notable for the following components:      Result Value   Potassium 3.3 (*)    Glucose, Bld 127 (*)    BUN 22 (*)    Creatinine, Ser 1.31 (*)    Calcium 8.8 (*)    All other components within normal limits  URINALYSIS, ROUTINE W REFLEX MICROSCOPIC - Abnormal; Notable for the following components:   Color, Urine YELLOW (*)    APPearance CLEAR (*)    Glucose, UA >=500 (*)    All other components within normal limits  LIPASE, BLOOD  CBC     EKG     RADIOLOGY I independently reviewed and interpreted imaging and agree with radiologists findings.     PROCEDURES:  Critical Care performed:   Procedures   MEDICATIONS ORDERED IN ED: Medications  iohexol (OMNIPAQUE) 300 MG/ML solution 100 mL (100 mLs Intravenous Contrast Given 03/18/22 1009)     IMPRESSION / MDM / ASSESSMENT AND PLAN / ED COURSE  I reviewed the  triage vital signs and the nursing notes.   Differential diagnosis includes, but is not limited to, cholelithiasis, cholecystitis, appendicitis, diverticulitis, ascites.  Patient is awake and alert, hemodynamically stable and afebrile.  He has diffuse abdominal tenderness on exam.  Further workup is indicated.  Labs obtained in triage are overall reassuring.  CT scan obtained for further evaluation.  CT scan demonstrates no acute findings.  Upon reevaluation, patient reports that his symptoms have improved significantly.  He notes that he has had a lot of burping and reports that he has run out of his Pepcid.  He is requesting a refill of this.  This was sent to his pharmacy.  We discussed return precautions and outpatient follow-up.  Patient or stands and agrees with plan.  He was discharged in stable condition.   Patient's presentation is most consistent with acute complicated illness / injury requiring diagnostic workup.      FINAL CLINICAL IMPRESSION(S) / ED DIAGNOSES   Final diagnoses:  Abdominal pain, unspecified abdominal location     Rx / DC Orders   ED Discharge Orders          Ordered    famotidine (PEPCID) 20 MG tablet  2 times daily        03/18/22 1045             Note:  This document was prepared using Dragon voice recognition software and may include unintentional dictation errors.   Emeline Gins 03/18/22 1132    Lavonia Drafts, MD 03/18/22 [phone]

## 2022-03-18 NOTE — Discharge Instructions (Signed)
Your CT, labs, and UA are normal. You may take the pepcid to help with your stomach upset and burping.  Please return for any new, worsening, or change in symptoms other concerns.

## 2022-04-24 ENCOUNTER — Encounter: Payer: Self-pay | Admitting: Cardiology

## 2022-04-24 NOTE — Telephone Encounter (Signed)
Error

## 2022-04-24 NOTE — Telephone Encounter (Signed)
error 

## 2022-04-24 NOTE — Telephone Encounter (Signed)
   Pre-operative Risk Assessment  Error

## 2022-04-26 ENCOUNTER — Ambulatory Visit: Payer: Medicare Other | Attending: Cardiology | Admitting: Cardiology

## 2022-04-27 ENCOUNTER — Encounter: Payer: Self-pay | Admitting: Cardiology

## 2022-05-02 NOTE — Progress Notes (Signed)
rescheduled

## 2022-05-03 ENCOUNTER — Ambulatory Visit: Payer: Medicare Other | Admitting: Internal Medicine

## 2022-05-03 DIAGNOSIS — Z91199 Patient's noncompliance with other medical treatment and regimen due to unspecified reason: Secondary | ICD-10-CM

## 2022-05-23 ENCOUNTER — Other Ambulatory Visit: Payer: Self-pay | Admitting: Family Medicine

## 2022-05-23 DIAGNOSIS — G459 Transient cerebral ischemic attack, unspecified: Secondary | ICD-10-CM

## 2022-06-05 ENCOUNTER — Ambulatory Visit
Admission: RE | Admit: 2022-06-05 | Discharge: 2022-06-05 | Disposition: A | Discharge status: Home / Self Care | Payer: Medicare Other | Source: Ambulatory Visit | Attending: Family Medicine | Admitting: Family Medicine

## 2022-06-05 DIAGNOSIS — G459 Transient cerebral ischemic attack, unspecified: Secondary | ICD-10-CM

## 2022-06-07 ENCOUNTER — Emergency Department: Payer: Medicare Other

## 2022-06-07 ENCOUNTER — Emergency Department
Admission: EM | Admit: 2022-06-07 | Discharge: 2022-06-07 | Disposition: A | Discharge status: Home / Self Care | Payer: Medicare Other | Attending: Emergency Medicine | Admitting: Emergency Medicine

## 2022-06-07 ENCOUNTER — Other Ambulatory Visit: Payer: Self-pay

## 2022-06-07 ENCOUNTER — Encounter: Payer: Self-pay | Admitting: *Deleted

## 2022-06-07 DIAGNOSIS — E119 Type 2 diabetes mellitus without complications: Secondary | ICD-10-CM | POA: Insufficient documentation

## 2022-06-07 DIAGNOSIS — L03011 Cellulitis of right finger: Secondary | ICD-10-CM | POA: Diagnosis not present

## 2022-06-07 DIAGNOSIS — M79644 Pain in right finger(s): Secondary | ICD-10-CM | POA: Diagnosis present

## 2022-06-07 DIAGNOSIS — L03019 Cellulitis of unspecified finger: Secondary | ICD-10-CM

## 2022-06-07 MED ORDER — CEPHALEXIN 500 MG PO CAPS: 500.0000 mg | ORAL_CAPSULE | Freq: Four times a day (QID) | ORAL | 0 refills | Status: AC

## 2022-06-07 MED ORDER — LIDOCAINE HCL (PF) 1 % IJ SOLN: 10.0000 mL | Freq: Once | INTRAMUSCULAR | Status: DC

## 2022-06-07 MED ORDER — CEPHALEXIN 500 MG PO CAPS
500.0000 mg | ORAL_CAPSULE | Freq: Once | ORAL | Status: AC
  Administered 2022-06-07: 500 mg via ORAL
  Filled 2022-06-07: qty 1

## 2022-06-07 MED ORDER — LIDOCAINE HCL (PF) 1 % IJ SOLN
10.0000 mL | Freq: Once | INTRAMUSCULAR | Status: AC
  Administered 2022-06-07: 10 mL via INTRADERMAL
  Filled 2022-06-07: qty 10

## 2022-06-07 MED ORDER — OXYCODONE-ACETAMINOPHEN 5-325 MG PO TABS: 1.0000 | ORAL_TABLET | ORAL | 0 refills | Status: AC | PRN

## 2022-06-07 MED ORDER — HYDROMORPHONE HCL 1 MG/ML IJ SOLN
1.0000 mg | Freq: Once | INTRAMUSCULAR | Status: AC
  Administered 2022-06-07: 1 mg via INTRAMUSCULAR
  Filled 2022-06-07: qty 1

## 2022-06-07 NOTE — ED Triage Notes (Signed)
Pt has pain and swelling to right thumb for several days.  No known injury.  Pt alert.

## 2022-06-07 NOTE — Discharge Instructions (Addendum)
Take the antibiotic as and finish the full 1 week course.  Change the dressing daily and apply bacitracin or Neosporin ointment to the wound.  Return to the ER for new, worsening, or persistent severe swelling, pain, bleeding, pus drainage, redness or pain going higher up the thumb or hand, fever or chills, or any other new or worsening symptoms that concern you.  We have provided a referral to the hand specialist across the street at [facility] for follow-up.

## 2022-06-07 NOTE — ED Provider Notes (Signed)
[facility] Provider Note    Event Date/Time   First MD Initiated Contact with Patient 06/07/22 2118     (approximate)   History   Hand Pain   HPI  Kevin Garrett is a 47 y.o. male with a history of diabetes and sleep apnea who presents with right thumb pain over the last couple days, gradual onset, severe intensity, not associate with any trauma.  The patient states that he is a frequent nail biter although he denies any other wounds to the hands.  He has no pain going up to the proximal thumb or the hand and has no fever or chills.  I reviewed the past medical records.  The patient was seen in the ED for abdominal pain on 1/6, and previously was admitted to the hospitalist service for lethargy and confusion on 12/23 of last year thought to be due to metabolic encephalopathy related to his diabetes.  Physical Exam   Triage Vital Signs: ED Triage Vitals  Enc Vitals Group     BP 06/07/22 2051 (!) 146/103     Pulse Rate 06/07/22 2051 88     Resp 06/07/22 2051 20     Temp 06/07/22 2051 98 F (36.7 C)     Temp Source 06/07/22 2051 Oral     SpO2 06/07/22 2051 98 %     Weight 06/07/22 2049 250 lb (113.4 kg)     Height 06/07/22 2049 5\' 9"  (1.753 m)     Head Circumference --      Peak Flow --      Pain Score 06/07/22 2049 10     Pain Loc --      Pain Edu? --      Excl. in [place]? --     Most recent vital signs: Vitals:   06/07/22 2051  BP: (!) 146/103  Pulse: 88  Resp: 20  Temp: 98 F (36.7 C)  SpO2: 98%     General: Awake, no distress.  CV:  Good peripheral perfusion.  Resp:  Normal effort.  Abd:  No distention.  Other:  Left thumb with full range of motion to the MCP and IP joints.  Swelling and fluctuance to the pad and to the ulnar aspect of the thumb next to the nail.  Normal cap refill.  Normal sensation.   ED Results / Procedures / Treatments   Labs (all labs ordered are listed, but only abnormal results are displayed) Labs Reviewed  - No data to display   EKG     RADIOLOGY  XR R hand: I independently viewed and interpreted the images; there is no acute fracture  PROCEDURES:  Critical Care performed: No  ..Incision and Drainage  Date/Time: 06/07/2022 10:52 PM  Performed by: Arta Silence, MD Authorized by: Arta Silence, MD   Consent:    Consent obtained:  Verbal   Consent given by:  Patient   Risks discussed:  Bleeding, infection, pain and incomplete drainage   Alternatives discussed:  Alternative treatment Universal protocol:    Patient identity confirmed:  Verbally with patient Location:    Indications for incision and drainage: felon.   Location: R thumb. Pre-procedure details:    Skin preparation:  Antiseptic wash Sedation:    Sedation type:  None Procedure details:    Incision types:  Single straight   Incision depth:  Submucosal   Drainage:  Bloody   Drainage amount:  Scant   Wound treatment:  Wound left open   Packing  materials:  None Post-procedure details:    Procedure completion:  Tolerated well, no immediate complications    MEDICATIONS ORDERED IN ED: Medications  HYDROmorphone (DILAUDID) injection 1 mg (1 mg Intramuscular Given 06/07/22 2129)  lidocaine (PF) (XYLOCAINE) 1 % injection 10 mL (10 mLs Intradermal Given 06/07/22 2144)  cephALEXin (KEFLEX) capsule 500 mg (500 mg Oral Given 06/07/22 2220)     IMPRESSION / MDM / ASSESSMENT AND PLAN / ED COURSE  I reviewed the triage vital signs and the nursing notes.  47 year old male with PMH as noted above presents with pain and swelling to the distal right thumb over the last few days.  Examination reveals swelling, fluctuance and slight erythema to the pad of the finger and the ulnar aspect.  The finger is neuro/vascular intact.  Differential diagnosis includes, but is not limited to, paronychia, felon, less likely gout.  Patient's presentation is most consistent with acute, uncomplicated illness.  X-rays do not  show any acute findings.  I attempted paronychia drainage inserting a scalpel adjacent to the nail with no significant return of pus.  I obtained verbal consent from the patient to attempt I&D of the felon.  A single straight incision was performed on the ulnar side of the thumb due to the swelling being more significant on that side.  There was scant return of bloody drainage but no significant pus.  Presentation is consistent with cellulitis of the distal thumb and likely early felon.  I have prescribed the patient a course of antibiotics and analgesia.  I have given him hand referral.  I gave strict return precautions and he expressed understanding.   FINAL CLINICAL IMPRESSION(S) / ED DIAGNOSES   Final diagnoses:  Felon of finger     Rx / DC Orders   ED Discharge Orders          Ordered    cephALEXin (KEFLEX) 500 MG capsule  4 times daily        06/07/22 2222    oxyCODONE-acetaminophen (PERCOCET) 5-325 MG tablet  Every 4 hours PRN        06/07/22 2222             Note:  This document was prepared using Dragon voice recognition software and may include unintentional dictation errors.    Arta Silence, MD 06/07/22 2255

## 2022-06-07 NOTE — ED Notes (Signed)
Ice pack provided for thumb pain. No jewelry present

## 2022-07-13 ENCOUNTER — Ambulatory Visit: Payer: Medicaid Other | Attending: Cardiology | Admitting: Cardiology

## 2022-07-14 ENCOUNTER — Encounter: Payer: Self-pay | Admitting: Cardiology

## 2022-09-11 ENCOUNTER — Encounter: Payer: Self-pay | Admitting: Cardiology

## 2022-09-11 ENCOUNTER — Ambulatory Visit: Payer: Medicare Other | Attending: Cardiology | Admitting: Cardiology

## 2022-12-20 ENCOUNTER — Other Ambulatory Visit: Payer: Self-pay

## 2022-12-20 ENCOUNTER — Emergency Department
Admission: EM | Admit: 2022-12-20 | Discharge: 2022-12-20 | Disposition: A | Discharge status: Home / Self Care | Payer: Medicare Other | Attending: Emergency Medicine | Admitting: Emergency Medicine

## 2022-12-20 ENCOUNTER — Emergency Department: Payer: Medicare Other

## 2022-12-20 DIAGNOSIS — I10 Essential (primary) hypertension: Secondary | ICD-10-CM | POA: Diagnosis not present

## 2022-12-20 DIAGNOSIS — E119 Type 2 diabetes mellitus without complications: Secondary | ICD-10-CM | POA: Insufficient documentation

## 2022-12-20 DIAGNOSIS — L03011 Cellulitis of right finger: Secondary | ICD-10-CM | POA: Diagnosis not present

## 2022-12-20 DIAGNOSIS — M79644 Pain in right finger(s): Secondary | ICD-10-CM | POA: Diagnosis present

## 2022-12-20 MED ORDER — OXYCODONE-ACETAMINOPHEN 5-325 MG PO TABS
1.0000 | ORAL_TABLET | Freq: Once | ORAL | Status: AC
  Administered 2022-12-20: 1 via ORAL
  Filled 2022-12-20: qty 1

## 2022-12-20 MED ORDER — LIDOCAINE HCL (PF) 1 % IJ SOLN
5.0000 mL | Freq: Once | INTRAMUSCULAR | Status: AC
  Administered 2022-12-20: 5 mL via INTRADERMAL
  Filled 2022-12-20: qty 5

## 2022-12-20 MED ORDER — CEPHALEXIN 500 MG PO CAPS: 500.0000 mg | ORAL_CAPSULE | Freq: Four times a day (QID) | ORAL | 0 refills | Status: AC

## 2022-12-20 MED ORDER — OXYCODONE-ACETAMINOPHEN 5-325 MG PO TABS: 1.0000 | ORAL_TABLET | Freq: Four times a day (QID) | ORAL | 0 refills | Status: AC | PRN

## 2022-12-20 NOTE — ED Triage Notes (Signed)
Pt presents to ED with c/o of R middle finger pain, finger nail is falling off and pus noted at nailbed, pt denies fevers or chills. NAD noted.

## 2022-12-20 NOTE — Discharge Instructions (Addendum)
Please take the antibiotics as prescribed. You can take the Percocet every 6 hours as needed for severe pain.  Otherwise you can take 650 mg of Tylenol and 600 mg of ibuprofen every 6 hours as needed for pain.   You will need to wash your finger with soap and water daily.  Then apply the nonadherent gauze with regular gauze over top.  Secure with tape.  Schedule a follow-up appointment with Dr. Joice Lofts.

## 2022-12-20 NOTE — ED Provider Notes (Signed)
[facility] [place] Provider Note    Event Date/Time   First MD Initiated Contact with Patient 12/20/22 1642     (approximate)   History   Hand Injury   HPI  Kevin Garrett is a 47 y.o. male with PMH of HTN, diabetes, anxiety, sleep apnea and nicotine dependence presents for evaluation of his right middle finger nail.  Patient states that few days ago it began to swell and became painful surrounding the tip of his right middle finger.  He noticed some pus draining from the area, he states last night it started to pus at the base of the nail lifting the nail away from the nailbed.  He reports significant pain to the area.  He denies any known injury or history of biting his cuticles.      Physical Exam   Triage Vital Signs: ED Triage Vitals  Encounter Vitals Group     BP 12/20/22 1341 (!) 147/100     Systolic BP Percentile --      Diastolic BP Percentile --      Pulse Rate 12/20/22 1341 100     Resp 12/20/22 1341 18     Temp 12/20/22 1341 98.5 F (36.9 C)     Temp Source 12/20/22 1341 Oral     SpO2 12/20/22 1341 97 %     Weight 12/20/22 1342 270 lb (122.5 kg)     Height 12/20/22 1342 5\' 9"  (1.753 m)     Head Circumference --      Peak Flow --      Pain Score 12/20/22 1341 6     Pain Loc --      Pain Education --      Exclude from Growth Chart --     Most recent vital signs: Vitals:   12/20/22 1341  BP: (!) 147/100  Pulse: 100  Resp: 18  Temp: 98.5 F (36.9 C)  SpO2: 97%    General: Awake, no distress.  CV:  Good peripheral perfusion.  Resp:  Normal effort.  Abd:  No distention.  Other:  Right middle finger nail is lifted away from the nailbed with pus draining around the sides, surrounding swelling, erythema and tenderness to palpation, patient is able to fully flex and extend the finger, capillary refill is appropriate and sensation is intact.   ED Results / Procedures / Treatments   Labs (all labs ordered are listed, but only  abnormal results are displayed) Labs Reviewed - No data to display   RADIOLOGY  Right middle finger x-ray obtained, interpreted the images as well as reviewed the radiologist report.   PROCEDURES:  Critical Care performed: No  .Nail Removal  Date/Time: 12/20/2022 6:31 PM  Performed by: Cameron Ali, PA-C Authorized by: Cameron Ali, PA-C   Consent:    Consent obtained:  Verbal   Consent given by:  Patient   Risks, benefits, and alternatives were discussed: yes     Risks discussed:  Bleeding, infection, pain and permanent nail deformity   Alternatives discussed:  No treatment Universal protocol:    Patient identity confirmed:  Verbally with patient Pre-procedure details:    Skin preparation:  Povidone-iodine   Preparation: Patient was prepped and draped in the usual sterile fashion   Procedure details:    Location:  Hand   Hand location:  R long finger Anesthesia:    Anesthesia method:  Nerve block   Block location:  Base of middle finger   Block needle  gauge:  27 G   Block anesthetic:  Lidocaine 1% w/o epi   Block technique:  Flexor tendon sheath   Block injection procedure:  Anatomic landmarks identified, introduced needle and incremental injection   Block outcome:  Anesthesia achieved Nail Removal:    Nail removed:  Complete   Nail bed repaired: no     Removed nail replaced and anchored: no   Trephination:    Subungual hematoma drained: no   Ingrown nail:    Wedge excision of skin: no     Nail matrix removed or ablated:  None Post-procedure details:    Dressing:  Xeroform gauze and 4x4 sterile gauze   Procedure completion:  Tolerated    MEDICATIONS ORDERED IN ED: Medications  lidocaine (PF) (XYLOCAINE) 1 % injection 5 mL (5 mLs Intradermal Given 12/20/22 1824)  oxyCODONE-acetaminophen (PERCOCET/ROXICET) 5-325 MG per tablet 1 tablet (1 tablet Oral Given 12/20/22 1827)     IMPRESSION / MDM / ASSESSMENT AND PLAN / ED COURSE  I reviewed the  triage vital signs and the nursing notes.                             48 year old male presents for evaluation of an infection to his right middle finger.  Patient was hypertensive in triage otherwise vital signs stable.  Patient is in pain on exam.  Differential diagnosis includes, but is not limited to, felon, paronychia, tensive synovitis, cellulitis.  Patient's presentation is most consistent with acute, uncomplicated illness.  Right middle finger x-ray obtained to evaluate for underlying osteomyelitis, interpreted the images as well as reviewed the radiologist report which did not show evidence of this.  On exam patient's fingernail was lifted away from the nailbed so I removed this and debrided the surrounding tissue.  Nailbed was flushed multiple times with saline.  Further description can be seen in the procedure note above.  Patient was given a couple days of pain medication as well as an oral antibiotic.  I instructed him on wound care.  He is to follow-up with orthopedics.  He voiced understanding, all questions were answered and he was stable at discharge.    FINAL CLINICAL IMPRESSION(S) / ED DIAGNOSES   Final diagnoses:  Paronychia of finger of right hand     Rx / DC Orders   ED Discharge Orders          Ordered    oxyCODONE-acetaminophen (PERCOCET) 5-325 MG tablet  Every 6 hours PRN        12/20/22 1813    cephALEXin (KEFLEX) 500 MG capsule  4 times daily        12/20/22 1813             Note:  This document was prepared using Dragon voice recognition software and may include unintentional dictation errors.   Cameron Ali, PA-C 12/20/22 Kermit Balo, MD 12/20/22 [phone]

## 2023-04-09 ENCOUNTER — Emergency Department
Admission: EM | Admit: 2023-04-09 | Discharge: 2023-04-09 | Disposition: A | Discharge status: Home / Self Care | Payer: Medicare Other | Attending: Emergency Medicine | Admitting: Emergency Medicine

## 2023-04-09 ENCOUNTER — Other Ambulatory Visit: Payer: Self-pay

## 2023-04-09 DIAGNOSIS — I10 Essential (primary) hypertension: Secondary | ICD-10-CM | POA: Diagnosis not present

## 2023-04-09 DIAGNOSIS — E119 Type 2 diabetes mellitus without complications: Secondary | ICD-10-CM | POA: Diagnosis not present

## 2023-04-09 DIAGNOSIS — S4991XA Unspecified injury of right shoulder and upper arm, initial encounter: Secondary | ICD-10-CM | POA: Diagnosis present

## 2023-04-09 DIAGNOSIS — Z23 Encounter for immunization: Secondary | ICD-10-CM | POA: Insufficient documentation

## 2023-04-09 DIAGNOSIS — S41111A Laceration without foreign body of right upper arm, initial encounter: Secondary | ICD-10-CM | POA: Diagnosis not present

## 2023-04-09 DIAGNOSIS — W268XXA Contact with other sharp object(s), not elsewhere classified, initial encounter: Secondary | ICD-10-CM | POA: Diagnosis not present

## 2023-04-09 MED ORDER — LIDOCAINE-EPINEPHRINE 2 %-1:100000 IJ SOLN
20.0000 mL | Freq: Once | INTRAMUSCULAR | Status: AC
  Administered 2023-04-09: 20 mL via INTRADERMAL
  Filled 2023-04-09: qty 1

## 2023-04-09 MED ORDER — LIDOCAINE-EPINEPHRINE (PF) 2 %-1:200000 IJ SOLN
10.0000 mL | Freq: Once | INTRAMUSCULAR | Status: AC
  Administered 2023-04-09: 10 mL
  Filled 2023-04-09: qty 20

## 2023-04-09 MED ORDER — TETANUS-DIPHTH-ACELL PERTUSSIS 5-2.5-18.5 LF-MCG/0.5 IM SUSY
0.5000 mL | PREFILLED_SYRINGE | Freq: Once | INTRAMUSCULAR | Status: AC
  Administered 2023-04-09: 0.5 mL via INTRAMUSCULAR
  Filled 2023-04-09: qty 0.5

## 2023-04-09 MED ORDER — OXYCODONE-ACETAMINOPHEN 7.5-325 MG PO TABS: 1.0000 | ORAL_TABLET | ORAL | 0 refills | Status: AC | PRN

## 2023-04-09 MED ORDER — CEPHALEXIN 500 MG PO CAPS: 500.0000 mg | ORAL_CAPSULE | Freq: Four times a day (QID) | ORAL | 0 refills | Status: AC

## 2023-04-09 MED ORDER — OXYCODONE-ACETAMINOPHEN 5-325 MG PO TABS
1.0000 | ORAL_TABLET | Freq: Once | ORAL | Status: AC
  Administered 2023-04-09: 1 via ORAL
  Filled 2023-04-09: qty 1

## 2023-04-09 NOTE — ED Notes (Signed)
See triage notes. Patient c/o approximately 7 inch laceration to the posterior side of his upper right arm.

## 2023-04-09 NOTE — Discharge Instructions (Addendum)
Keep sutures dry for first 24 hrs. Keep suture site of clean & dry. Gently use soap & water after first 24 hrs. DO NOT USE alcohol, hydrogen peroxide etc, to clean skin. You may cover the incision with clean gauze & replace it after your daily shower for your comfort.  Change dressing 2 times a day. Wear sling for comfort.   If you have been prescribed antibiotics, take them exactly as directed. Do not stop taking them because your symptoms have improved. If you don't finish the entire antibiotic course, you can breed resistant infections which are harder to treat.    Return to ED or got to your PCP for suture removal in 10-14 days. Return to ED if signs of infection occur as discussed such as fever, discharge from wound or redness that is spreading.

## 2023-04-09 NOTE — ED Triage Notes (Signed)
Pt has approx. 7 inch laceration to the posterior side of his right upper arm due to getting cut by sheet metal. Bandage is applied to the wound, bleeding controlled at this time.

## 2023-04-09 NOTE — ED Provider Notes (Signed)
[facility] Emergency Department Provider Note     Event Date/Time   First MD Initiated Contact with Patient 04/09/23 1818     (approximate)   History   Laceration   HPI  Kevin Garrett is a 48 y.o. male with a history of HTN and diabetes presents to the ED for evaluation of an arm injury sustaining a laceration to his posterior right upper arm after falling onto a sheet metal piece while working prior to ED arrival.  He reports he was on a wooden board piece that broke.  He denies hitting his head or LOC. No other complaints.     Physical Exam   Triage Vital Signs: ED Triage Vitals  Encounter Vitals Group     BP 04/09/23 1427 (!) 151/114     Systolic BP Percentile --      Diastolic BP Percentile --      Pulse Rate 04/09/23 1427 86     Resp 04/09/23 1427 18     Temp 04/09/23 1427 97.7 F (36.5 C)     Temp Source 04/09/23 1427 Oral     SpO2 04/09/23 1427 95 %     Weight 04/09/23 1428 260 lb (117.9 kg)     Height 04/09/23 1428 5\' 9"  (1.753 m)     Head Circumference --      Peak Flow --      Pain Score 04/09/23 1427 10     Pain Loc --      Pain Education --      Exclude from Growth Chart --     Most recent vital signs: Vitals:   04/09/23 1427 04/09/23 1920  BP: (!) 151/114   Pulse: 86   Resp: 18   Temp: 97.7 F (36.5 C) 98 F (36.7 C)  SpO2: 95%     General Awake, no distress.  HEENT NCAT. PERRL. EOMI. No rhinorrhea. Mucous membranes are moist.  CV:  Good peripheral perfusion.  RESP:  Normal effort.  ABD:  No distention.  Other:  Left posterior UE reveals ~ 7-8 cm irregular shaped gaped open wound, approximately. 3 mm deep. Bleeding controlled.   Right forearm reveals linear abrasion from wrist to elbow on ventral aspect. FROM without difficulty bilaterally.  Neurovascular status intact throughout bilaterally. Brisk and normal capillary refills.    ED Results / Procedures / Treatments   Labs (all labs ordered are listed, but  only abnormal results are displayed) Labs Reviewed - No data to display  No results found.  PROCEDURES:  Critical Care performed: No  .Laceration Repair  Date/Time: 04/09/2023 8:25 PM  Performed by: Kern Reap A, PA-C Authorized by: Kern Reap A, PA-C   Consent:    Consent obtained:  Verbal   Consent given by:  Patient   Risks discussed:  Infection, pain, poor cosmetic result, vascular damage, nerve damage and poor wound healing Anesthesia:    Anesthesia method:  Local infiltration   Local anesthetic:  Lidocaine 2% WITH epi Laceration details:    Location:  Shoulder/arm   Shoulder/arm location:  R upper arm   Length (cm):  8   Depth (mm):  3 Exploration:    Hemostasis achieved with:  Direct pressure and epinephrine Treatment:    Area cleansed with:  Povidone-iodine and saline   Amount of cleaning:  Extensive   Irrigation solution:  Sterile saline   Irrigation method:  Pressure wash Skin repair:    Repair method:  Sutures   Suture  size:  3-0 and 4-0   Suture material:  Nylon   Suture technique:  Simple interrupted   Number of sutures:  20 Repair type:    Repair type:  Intermediate Post-procedure details:    Dressing:  Non-adherent dressing   Procedure completion:  Tolerated    MEDICATIONS ORDERED IN ED: Medications  lidocaine-EPINEPHrine (XYLOCAINE W/EPI) 2 %-1:100000 (with pres) injection 20 mL (20 mLs Intradermal Given by Other 04/09/23 1911)  lidocaine-EPINEPHrine (XYLOCAINE W/EPI) 2 %-1:200000 (PF) injection 10 mL (10 mLs Infiltration Given by Other 04/09/23 1911)  Tdap (BOOSTRIX) injection 0.5 mL (0.5 mLs Intramuscular Given 04/09/23 2033)  oxyCODONE-acetaminophen (PERCOCET/ROXICET) 5-325 MG per tablet 1 tablet (1 tablet Oral Given 04/09/23 2033)     IMPRESSION / MDM / ASSESSMENT AND PLAN / ED COURSE  I reviewed the triage vital signs and the nursing notes.                               48 y.o. male presents to the emergency department for  evaluation and treatment of laceration. See HPI for further details.   Differential diagnosis includes, but is not limited to laceration, tendon injury, neurovascular injury  Patient's presentation is most consistent with acute complicated illness / injury requiring diagnostic workup.  Patient is alert and oriented.  Please see procedure note for laceration repair.  Patient tolerated well.  20 sutures placed with simple interrupted technique. Tetanus administered in ED. area cleaned thoroughly and dressed appropriately with nonadherent bandaging.  Education and instructions on how to perform wound care at home provided.  Patient placed in sling for comfort.  Patient is advised to return to ED or follow-up with his PCP in 10 to 14 days for suture removal or sooner if any signs of infections as discussed.  Patient verbalized understanding.  Patient will be sent home with a prescription of Keflex. at this time I do believe patient is in stable condition for discharge home.  All questions concerns were addressed during this ED visit.   FINAL CLINICAL IMPRESSION(S) / ED DIAGNOSES   Final diagnoses:  Laceration of upper extremity, right, initial encounter     Rx / DC Orders   ED Discharge Orders          Ordered    cephALEXin (KEFLEX) 500 MG capsule  4 times daily        04/09/23 2024    oxyCODONE-acetaminophen (PERCOCET) 7.5-325 MG tablet  Every 4 hours PRN        04/09/23 2026             Note:  This document was prepared using Dragon voice recognition software and may include unintentional dictation errors.    Romeo Apple, Maram Bently A, PA-C 04/10/23 1130    Jene Every, MD 04/14/23 0800

## 2023-04-25 ENCOUNTER — Other Ambulatory Visit: Payer: Self-pay

## 2023-04-25 ENCOUNTER — Emergency Department
Admission: EM | Admit: 2023-04-25 | Discharge: 2023-04-26 | Disposition: A | Discharge status: Home / Self Care | Payer: Medicare Other | Attending: Emergency Medicine | Admitting: Emergency Medicine

## 2023-04-25 ENCOUNTER — Emergency Department: Payer: Medicare Other

## 2023-04-25 ENCOUNTER — Encounter: Payer: Medicare Other | Attending: Physician Assistant | Admitting: Physician Assistant

## 2023-04-25 DIAGNOSIS — X58XXXA Exposure to other specified factors, initial encounter: Secondary | ICD-10-CM | POA: Diagnosis not present

## 2023-04-25 DIAGNOSIS — E119 Type 2 diabetes mellitus without complications: Secondary | ICD-10-CM | POA: Insufficient documentation

## 2023-04-25 DIAGNOSIS — I10 Essential (primary) hypertension: Secondary | ICD-10-CM | POA: Insufficient documentation

## 2023-04-25 DIAGNOSIS — L98492 Non-pressure chronic ulcer of skin of other sites with fat layer exposed: Secondary | ICD-10-CM | POA: Insufficient documentation

## 2023-04-25 DIAGNOSIS — R079 Chest pain, unspecified: Secondary | ICD-10-CM | POA: Diagnosis present

## 2023-04-25 DIAGNOSIS — E11622 Type 2 diabetes mellitus with other skin ulcer: Secondary | ICD-10-CM | POA: Diagnosis present

## 2023-04-25 DIAGNOSIS — N182 Chronic kidney disease, stage 2 (mild): Secondary | ICD-10-CM | POA: Insufficient documentation

## 2023-04-25 DIAGNOSIS — S41111A Laceration without foreign body of right upper arm, initial encounter: Secondary | ICD-10-CM | POA: Insufficient documentation

## 2023-04-25 DIAGNOSIS — Z79899 Other long term (current) drug therapy: Secondary | ICD-10-CM | POA: Insufficient documentation

## 2023-04-25 DIAGNOSIS — E1122 Type 2 diabetes mellitus with diabetic chronic kidney disease: Secondary | ICD-10-CM | POA: Diagnosis not present

## 2023-04-25 DIAGNOSIS — I129 Hypertensive chronic kidney disease with stage 1 through stage 4 chronic kidney disease, or unspecified chronic kidney disease: Secondary | ICD-10-CM | POA: Insufficient documentation

## 2023-04-25 LAB — CBC
HCT: 46.6 % (ref 39.0–52.0)
Hemoglobin: 15.9 g/dL (ref 13.0–17.0)
MCH: 28.5 pg (ref 26.0–34.0)
MCHC: 34.1 g/dL (ref 30.0–36.0)
MCV: 83.7 fL (ref 80.0–100.0)
Platelets: 231 10*3/uL (ref 150–400)
RBC: 5.57 MIL/uL (ref 4.22–5.81)
RDW: 13.9 % (ref 11.5–15.5)
WBC: 7.4 10*3/uL (ref 4.0–10.5)
nRBC: 0 % (ref 0.0–0.2)

## 2023-04-25 LAB — BASIC METABOLIC PANEL
Anion gap: 10 (ref 5–15)
BUN: 21 mg/dL — ABNORMAL HIGH (ref 6–20)
CO2: 24 mmol/L (ref 22–32)
Calcium: 8.8 mg/dL — ABNORMAL LOW (ref 8.9–10.3)
Chloride: 104 mmol/L (ref 98–111)
Creatinine, Ser: 1.57 mg/dL — ABNORMAL HIGH (ref 0.61–1.24)
GFR, Estimated: 54 mL/min — ABNORMAL LOW (ref >60)
Glucose, Bld: 114 mg/dL — ABNORMAL HIGH (ref 70–99)
Potassium: 3.5 mmol/L (ref 3.5–5.1)
Sodium: 139 mmol/L (ref 135–145)

## 2023-04-25 LAB — TROPONIN I (HIGH SENSITIVITY): Troponin I (High Sensitivity): 20 ng/L — ABNORMAL HIGH (ref <18)

## 2023-04-25 NOTE — ED Triage Notes (Signed)
Pt to ED via POV c/o hypertension and CP.  Reports Bp was high since earlier today. Last bp was 195/116. Hx of HTN. Also endorsing central CP that started tonight. Non radiating. Denies SHOB ,dizziness, fevers

## 2023-04-26 DIAGNOSIS — I10 Essential (primary) hypertension: Secondary | ICD-10-CM | POA: Diagnosis not present

## 2023-04-26 LAB — TROPONIN I (HIGH SENSITIVITY): Troponin I (High Sensitivity): 17 ng/L (ref <18)

## 2023-04-26 MED ORDER — ASPIRIN 81 MG PO CHEW
324.0000 mg | CHEWABLE_TABLET | Freq: Once | ORAL | Status: AC
  Administered 2023-04-26: 324 mg via ORAL
  Filled 2023-04-26: qty 4

## 2023-04-26 NOTE — ED Provider Notes (Signed)
[facility] Provider Note    Event Date/Time   First MD Initiated Contact with Patient 04/26/23 0012     (approximate)   History   Hypertension and Chest Pain   HPI  Kevin Garrett is a 48 y.o. male history of hypertension also history of cocaine abuse, noncompliance, diabetes.  On previous admission in December 23 he had encephalopathy was also treated with hypertension for with Norvasc and lisinopril   Patient reports that he has had about 10 members of his family doctor last year's been concerning to him.  He was feeling anxious thinking about the loss of summary family members and reports he started feeling around 8 or 9:00 this evening he started having chest tightness.  He also reports that he used to use cocaine but decided to stop doing that about a week ago.  The tightness has since gone away.  He checked his blood pressure at home and it was quite high like 190.  Now its come back down to normal and he reports after resting and using his blood pressure medication today that his symptoms seem to be fine.  He feels fine at this time has no complaint no ongoing chest pain or discomfort.  He does not believe he is ever had a stress test  He has a family history of heart disease.  He has a history of cocaine smoking.  He denies a personal history of coronary disease but also reports he has not follow-up with a cardiologist to his recollection or had any sort of stress test  Physical Exam   Triage Vital Signs: ED Triage Vitals  Encounter Vitals Group     BP 04/25/23 2127 (!) 159/107     Systolic BP Percentile --      Diastolic BP Percentile --      Pulse Rate 04/25/23 2127 74     Resp 04/25/23 2127 20     Temp 04/25/23 2127 98.8 F (37.1 C)     Temp Source 04/25/23 2127 Oral     SpO2 04/25/23 2127 97 %     Weight 04/25/23 2124 263 lb (119.3 kg)     Height 04/25/23 2124 5\' 9"  (1.753 m)     Head Circumference --      Peak Flow --      Pain Score  04/25/23 2124 8     Pain Loc --      Pain Education --      Exclude from Growth Chart --     Most recent vital signs: Vitals:   04/25/23 2127 04/26/23 0039  BP: (!) 159/107 124/81  Pulse: 74 75  Resp: 20 20  Temp: 98.8 F (37.1 C) 97.6 F (36.4 C)  SpO2: 97% 99%   Vitals:   04/25/23 2127 04/26/23 0039  BP: (!) 159/107 124/81  Pulse: 74 75  Resp: 20 20  Temp: 98.8 F (37.1 C) 97.6 F (36.4 C)  SpO2: 97% 99%     General: Awake, no distress.  CV:  Good peripheral perfusion.  Normal tones and rate Resp:  Normal effort.  Clear bilateral Abd:  No distention.  Soft nontender nondistended Other:  No lower extremity edema.  Lung sounds are clear.  Denies shortness of breath.  Of note I examined the patient at approximately 12:30 AM and at this time he reports all of his symptoms have gone away   ED Results / Procedures / Treatments   Labs (all labs ordered are listed,  but only abnormal results are displayed) Labs Reviewed  BASIC METABOLIC PANEL - Abnormal; Notable for the following components:      Result Value   Glucose, Bld 114 (*)    BUN 21 (*)    Creatinine, Ser 1.57 (*)    Calcium 8.8 (*)    GFR, Estimated 54 (*)    All other components within normal limits  TROPONIN I (HIGH SENSITIVITY) - Abnormal; Notable for the following components:   Troponin I (High Sensitivity) 20 (*)    All other components within normal limits  CBC  TROPONIN I (HIGH SENSITIVITY)   Labs demonstrate very mildly elevated troponin actually slightly reduced from previous historic values.  Creatinine 1.6 mildly reduced GFR.  No evidence of acute abnormality except for subtle reduction in renal function.  CBC normal  EKG  Inter by me at 2130 heart rate 70 QRS 80 QTc 440 Normal sinus rhythm, mild ST segment abnormality including inversions noted in lateral precordial leads as well as slight perceived depression in 2 and a III  Compared to previous EKG in March 01, 2022 morphology appears  similar.  No evidence of acute ischemic change, heart rate reduced on today's twelve-lead   RADIOLOGY Chest x-ray interpreted by me as negative for acute finding    PROCEDURES:  Critical Care performed: No  Procedures   MEDICATIONS ORDERED IN ED: Medications  aspirin chewable tablet 324 mg (324 mg Oral Given 04/26/23 0049)     IMPRESSION / MDM / ASSESSMENT AND PLAN / ED COURSE  I reviewed the triage vital signs and the nursing notes.                              Differential diagnosis includes, but is not limited to, ACS, aortic dissection, pulmonary embolism, cardiac tamponade, pneumothorax, pneumonia, pericarditis, myocarditis, GI-related causes including esophagitis/gastritis, and musculoskeletal chest wall pain.    Patient has at least moderate risk factors for coronary disease.  His ECG does not show acute abnormalities first troponin is in keeping with historical, slightly better than previous.  He is currently pain and symptom free he was apparently quite hypertensive at home, but has medicated with his blood pressure medicine and he is now asymptomatic.  I think his asymptomatic status is quite reassuring at this point.  Anticipate and patient agreeable, he would follow-up closely with cardiology if his repeat troponin does not show significant delta change.  He is currently asymptomatic will provide aspirin and encouraged him to start taking a baby aspirin daily.  Patient's presentation is most consistent with acute complicated illness / injury requiring diagnostic workup.   ----------------------------------------- 1:38 AM on 04/26/2023 ----------------------------------------- Workup reassuring blood pressure is normalized.  Appears appropriate for discharge.  Repeat troponin is normal.  No evidence of ACS.  Compliance with medication and avoidance of cocaine, return precautions and recommendations to follow-up with cardiology been discussed with the patient        FINAL CLINICAL IMPRESSION(S) / ED DIAGNOSES   Final diagnoses:  Hypertension, unspecified type  Chest pain with low risk of acute coronary syndrome     Rx / DC Orders   ED Discharge Orders          Ordered    Ambulatory referral to Cardiology       Comments: ER chest pain follow-up   04/26/23 0046             Note:  This document was  prepared using Conservation officer, historic buildings and may include unintentional dictation errors.   Sharyn Creamer, MD 04/26/23 [phone]

## 2023-04-26 NOTE — Discharge Instructions (Signed)
You have been seen in the Emergency Department (ED) today for chest pain.  As we have discussed todays test results are normal, but you may require further testing.  Please follow up with the recommended doctor as instructed above in these documents regarding todays emergent visit and your recent symptoms to discuss further management.  Continue to take your regular medications. If you are not doing so already, please also take a daily baby aspirin (81 mg), at least until you follow up with your doctor.  Return to the Emergency Department (ED) if you experience any further chest pain/pressure/tightness, difficulty breathing, or sudden sweating, or other symptoms that concern you.

## 2023-05-09 ENCOUNTER — Encounter: Payer: Medicare Other | Admitting: Physician Assistant

## 2023-05-09 DIAGNOSIS — E11622 Type 2 diabetes mellitus with other skin ulcer: Secondary | ICD-10-CM | POA: Diagnosis not present

## 2023-05-23 ENCOUNTER — Ambulatory Visit: Payer: Medicare Other | Admitting: Physician Assistant

## 2023-05-30 ENCOUNTER — Encounter: Attending: Physician Assistant | Admitting: Physician Assistant

## 2023-05-30 DIAGNOSIS — E11622 Type 2 diabetes mellitus with other skin ulcer: Secondary | ICD-10-CM | POA: Insufficient documentation

## 2023-05-30 DIAGNOSIS — X58XXXA Exposure to other specified factors, initial encounter: Secondary | ICD-10-CM | POA: Diagnosis not present

## 2023-05-30 DIAGNOSIS — L98492 Non-pressure chronic ulcer of skin of other sites with fat layer exposed: Secondary | ICD-10-CM | POA: Diagnosis not present

## 2023-05-30 DIAGNOSIS — I10 Essential (primary) hypertension: Secondary | ICD-10-CM | POA: Insufficient documentation

## 2023-05-30 DIAGNOSIS — S41111A Laceration without foreign body of right upper arm, initial encounter: Secondary | ICD-10-CM | POA: Diagnosis present

## 2023-06-13 ENCOUNTER — Ambulatory Visit: Admitting: Physician Assistant

## 2023-07-04 ENCOUNTER — Ambulatory Visit: Admitting: Physician Assistant

## 2023-07-12 ENCOUNTER — Encounter: Attending: Physician Assistant | Admitting: Physician Assistant

## 2023-07-12 DIAGNOSIS — L98492 Non-pressure chronic ulcer of skin of other sites with fat layer exposed: Secondary | ICD-10-CM | POA: Insufficient documentation

## 2023-07-12 DIAGNOSIS — X58XXXA Exposure to other specified factors, initial encounter: Secondary | ICD-10-CM | POA: Insufficient documentation

## 2023-07-12 DIAGNOSIS — I10 Essential (primary) hypertension: Secondary | ICD-10-CM | POA: Diagnosis not present

## 2023-07-12 DIAGNOSIS — E11622 Type 2 diabetes mellitus with other skin ulcer: Secondary | ICD-10-CM | POA: Diagnosis not present

## 2023-07-12 DIAGNOSIS — S41111A Laceration without foreign body of right upper arm, initial encounter: Secondary | ICD-10-CM | POA: Insufficient documentation

## 2023-07-18 ENCOUNTER — Ambulatory Visit: Payer: Medicare Other | Attending: Cardiology | Admitting: Cardiology

## 2023-07-20 ENCOUNTER — Encounter: Admitting: Physician Assistant

## 2023-07-20 DIAGNOSIS — S41111A Laceration without foreign body of right upper arm, initial encounter: Secondary | ICD-10-CM | POA: Diagnosis not present
